# Patient Record
Sex: Female | Born: 1970 | Race: Black or African American | Hispanic: No | Marital: Single | Smoking: Former smoker
Health system: Southern US, Community
[De-identification: ages and names within clinical notes are randomized; demographics above are authoritative.]

## PROBLEM LIST (undated history)

## (undated) DIAGNOSIS — F209 Schizophrenia, unspecified: Secondary | ICD-10-CM

## (undated) DIAGNOSIS — D649 Anemia, unspecified: Secondary | ICD-10-CM

## (undated) DIAGNOSIS — I10 Essential (primary) hypertension: Secondary | ICD-10-CM

## (undated) HISTORY — DX: Schizophrenia, unspecified: F20.9

## (undated) HISTORY — PX: ABDOMINAL HYSTERECTOMY: SHX81

---

## 1998-11-25 ENCOUNTER — Observation Stay (HOSPITAL_COMMUNITY): Admission: AD | Admit: 1998-11-25 | Discharge: 1998-11-26 | Payer: Self-pay | Admitting: Obstetrics

## 1999-04-17 ENCOUNTER — Inpatient Hospital Stay (HOSPITAL_COMMUNITY): Admission: AD | Admit: 1999-04-17 | Discharge: 1999-04-17 | Payer: Self-pay | Admitting: Obstetrics & Gynecology

## 1999-04-17 ENCOUNTER — Encounter: Payer: Self-pay | Admitting: Obstetrics & Gynecology

## 1999-05-23 ENCOUNTER — Inpatient Hospital Stay (HOSPITAL_COMMUNITY): Admission: AD | Admit: 1999-05-23 | Discharge: 1999-05-27 | Payer: Self-pay | Admitting: *Deleted

## 1999-10-27 ENCOUNTER — Emergency Department (HOSPITAL_COMMUNITY): Admission: EM | Admit: 1999-10-27 | Discharge: 1999-10-27 | Payer: Self-pay | Admitting: Emergency Medicine

## 1999-10-27 ENCOUNTER — Encounter: Payer: Self-pay | Admitting: Emergency Medicine

## 2002-09-01 ENCOUNTER — Encounter: Payer: Self-pay | Admitting: Emergency Medicine

## 2002-09-01 ENCOUNTER — Emergency Department (HOSPITAL_COMMUNITY): Admission: EM | Admit: 2002-09-01 | Discharge: 2002-09-01 | Payer: Self-pay | Admitting: Emergency Medicine

## 2002-11-11 ENCOUNTER — Emergency Department (HOSPITAL_COMMUNITY): Admission: EM | Admit: 2002-11-11 | Discharge: 2002-11-11 | Payer: Self-pay | Admitting: Emergency Medicine

## 2004-04-11 ENCOUNTER — Inpatient Hospital Stay (HOSPITAL_COMMUNITY): Admission: AD | Admit: 2004-04-11 | Discharge: 2004-04-11 | Payer: Self-pay | Admitting: Gynecology

## 2005-02-06 ENCOUNTER — Emergency Department (HOSPITAL_COMMUNITY): Admission: EM | Admit: 2005-02-06 | Discharge: 2005-02-06 | Payer: Self-pay | Admitting: Emergency Medicine

## 2005-02-13 ENCOUNTER — Emergency Department (HOSPITAL_COMMUNITY): Admission: EM | Admit: 2005-02-13 | Discharge: 2005-02-13 | Payer: Self-pay | Admitting: Emergency Medicine

## 2005-03-03 ENCOUNTER — Emergency Department (HOSPITAL_COMMUNITY): Admission: EM | Admit: 2005-03-03 | Discharge: 2005-03-03 | Payer: Self-pay | Admitting: Emergency Medicine

## 2005-04-06 ENCOUNTER — Emergency Department (HOSPITAL_COMMUNITY): Admission: EM | Admit: 2005-04-06 | Discharge: 2005-04-07 | Payer: Self-pay | Admitting: *Deleted

## 2005-06-09 ENCOUNTER — Emergency Department (HOSPITAL_COMMUNITY): Admission: EM | Admit: 2005-06-09 | Discharge: 2005-06-09 | Payer: Self-pay | Admitting: Emergency Medicine

## 2005-07-14 ENCOUNTER — Emergency Department (HOSPITAL_COMMUNITY): Admission: EM | Admit: 2005-07-14 | Discharge: 2005-07-14 | Payer: Self-pay | Admitting: Emergency Medicine

## 2005-12-03 ENCOUNTER — Emergency Department (HOSPITAL_COMMUNITY): Admission: EM | Admit: 2005-12-03 | Discharge: 2005-12-03 | Payer: Self-pay | Admitting: Emergency Medicine

## 2005-12-22 ENCOUNTER — Emergency Department (HOSPITAL_COMMUNITY): Admission: EM | Admit: 2005-12-22 | Discharge: 2005-12-22 | Payer: Self-pay | Admitting: Emergency Medicine

## 2006-01-03 ENCOUNTER — Emergency Department (HOSPITAL_COMMUNITY): Admission: EM | Admit: 2006-01-03 | Discharge: 2006-01-03 | Payer: Self-pay | Admitting: Emergency Medicine

## 2006-07-23 ENCOUNTER — Emergency Department (HOSPITAL_COMMUNITY): Admission: EM | Admit: 2006-07-23 | Discharge: 2006-07-24 | Payer: Self-pay | Admitting: Emergency Medicine

## 2006-07-27 ENCOUNTER — Emergency Department (HOSPITAL_COMMUNITY): Admission: EM | Admit: 2006-07-27 | Discharge: 2006-07-27 | Payer: Self-pay | Admitting: Emergency Medicine

## 2006-09-23 ENCOUNTER — Emergency Department (HOSPITAL_COMMUNITY): Admission: EM | Admit: 2006-09-23 | Discharge: 2006-09-23 | Payer: Self-pay | Admitting: Emergency Medicine

## 2006-11-28 ENCOUNTER — Emergency Department (HOSPITAL_COMMUNITY): Admission: EM | Admit: 2006-11-28 | Discharge: 2006-11-28 | Payer: Self-pay | Admitting: Emergency Medicine

## 2006-12-04 ENCOUNTER — Emergency Department (HOSPITAL_COMMUNITY): Admission: EM | Admit: 2006-12-04 | Discharge: 2006-12-04 | Payer: Self-pay | Admitting: Emergency Medicine

## 2006-12-05 ENCOUNTER — Emergency Department (HOSPITAL_COMMUNITY): Admission: EM | Admit: 2006-12-05 | Discharge: 2006-12-05 | Payer: Self-pay | Admitting: Emergency Medicine

## 2007-02-03 ENCOUNTER — Emergency Department (HOSPITAL_COMMUNITY): Admission: EM | Admit: 2007-02-03 | Discharge: 2007-02-03 | Payer: Self-pay | Admitting: Family Medicine

## 2007-09-04 ENCOUNTER — Inpatient Hospital Stay (HOSPITAL_COMMUNITY): Admission: AD | Admit: 2007-09-04 | Discharge: 2007-09-04 | Payer: Self-pay | Admitting: Obstetrics & Gynecology

## 2007-09-10 ENCOUNTER — Emergency Department (HOSPITAL_COMMUNITY): Admission: EM | Admit: 2007-09-10 | Discharge: 2007-09-10 | Payer: Self-pay | Admitting: Emergency Medicine

## 2007-09-24 ENCOUNTER — Emergency Department (HOSPITAL_COMMUNITY): Admission: EM | Admit: 2007-09-24 | Discharge: 2007-09-24 | Payer: Self-pay | Admitting: Emergency Medicine

## 2007-10-09 ENCOUNTER — Inpatient Hospital Stay (HOSPITAL_COMMUNITY): Admission: AD | Admit: 2007-10-09 | Discharge: 2007-10-09 | Payer: Self-pay | Admitting: Obstetrics & Gynecology

## 2007-11-25 ENCOUNTER — Ambulatory Visit: Payer: Self-pay | Admitting: Obstetrics and Gynecology

## 2007-11-25 ENCOUNTER — Inpatient Hospital Stay (HOSPITAL_COMMUNITY): Admission: AD | Admit: 2007-11-25 | Discharge: 2007-11-26 | Payer: Self-pay | Admitting: Obstetrics and Gynecology

## 2007-11-27 ENCOUNTER — Ambulatory Visit: Payer: Self-pay | Admitting: Physician Assistant

## 2007-11-27 ENCOUNTER — Encounter: Payer: Self-pay | Admitting: Family Medicine

## 2007-11-27 ENCOUNTER — Observation Stay (HOSPITAL_COMMUNITY): Admission: AD | Admit: 2007-11-27 | Discharge: 2007-11-27 | Payer: Self-pay | Admitting: Family Medicine

## 2008-04-08 ENCOUNTER — Emergency Department (HOSPITAL_COMMUNITY): Admission: EM | Admit: 2008-04-08 | Discharge: 2008-04-09 | Payer: Self-pay | Admitting: Emergency Medicine

## 2008-04-10 ENCOUNTER — Emergency Department (HOSPITAL_COMMUNITY): Admission: EM | Admit: 2008-04-10 | Discharge: 2008-04-10 | Payer: Self-pay | Admitting: Emergency Medicine

## 2008-04-19 ENCOUNTER — Inpatient Hospital Stay (HOSPITAL_COMMUNITY): Admission: AD | Admit: 2008-04-19 | Discharge: 2008-04-19 | Payer: Self-pay | Admitting: Obstetrics & Gynecology

## 2008-08-20 ENCOUNTER — Emergency Department (HOSPITAL_COMMUNITY): Admission: EM | Admit: 2008-08-20 | Discharge: 2008-08-20 | Payer: Self-pay | Admitting: Emergency Medicine

## 2008-08-21 ENCOUNTER — Emergency Department (HOSPITAL_COMMUNITY): Admission: EM | Admit: 2008-08-21 | Discharge: 2008-08-21 | Payer: Self-pay | Admitting: Family Medicine

## 2008-08-22 ENCOUNTER — Encounter (HOSPITAL_BASED_OUTPATIENT_CLINIC_OR_DEPARTMENT_OTHER): Admission: RE | Admit: 2008-08-22 | Discharge: 2008-09-25 | Payer: Self-pay | Admitting: Internal Medicine

## 2008-12-07 ENCOUNTER — Emergency Department (HOSPITAL_COMMUNITY): Admission: EM | Admit: 2008-12-07 | Discharge: 2008-12-07 | Payer: Self-pay | Admitting: Emergency Medicine

## 2008-12-22 ENCOUNTER — Inpatient Hospital Stay (HOSPITAL_COMMUNITY): Admission: AD | Admit: 2008-12-22 | Discharge: 2008-12-22 | Payer: Self-pay | Admitting: Obstetrics & Gynecology

## 2009-01-07 ENCOUNTER — Inpatient Hospital Stay (HOSPITAL_COMMUNITY): Admission: AD | Admit: 2009-01-07 | Discharge: 2009-01-07 | Payer: Self-pay | Admitting: Obstetrics and Gynecology

## 2009-01-07 ENCOUNTER — Emergency Department (HOSPITAL_BASED_OUTPATIENT_CLINIC_OR_DEPARTMENT_OTHER): Admission: EM | Admit: 2009-01-07 | Discharge: 2009-01-07 | Payer: Self-pay | Admitting: Emergency Medicine

## 2009-01-22 ENCOUNTER — Emergency Department (HOSPITAL_COMMUNITY): Admission: EM | Admit: 2009-01-22 | Discharge: 2009-01-22 | Payer: Self-pay | Admitting: Emergency Medicine

## 2009-03-08 ENCOUNTER — Inpatient Hospital Stay (HOSPITAL_COMMUNITY): Admission: AD | Admit: 2009-03-08 | Discharge: 2009-03-08 | Payer: Self-pay | Admitting: Obstetrics & Gynecology

## 2009-05-01 ENCOUNTER — Inpatient Hospital Stay (HOSPITAL_COMMUNITY): Admission: AD | Admit: 2009-05-01 | Discharge: 2009-05-01 | Payer: Self-pay | Admitting: Obstetrics & Gynecology

## 2009-05-23 ENCOUNTER — Ambulatory Visit: Payer: Self-pay | Admitting: Obstetrics & Gynecology

## 2009-05-23 ENCOUNTER — Encounter: Payer: Self-pay | Admitting: Obstetrics and Gynecology

## 2009-05-23 LAB — CONVERTED CEMR LAB
ALT: 8 units/L (ref 0–35)
AST: 19 units/L (ref 0–37)
Alkaline Phosphatase: 84 units/L (ref 39–117)
BUN: 10 mg/dL (ref 6–23)
Calcium: 8.7 mg/dL (ref 8.4–10.5)
Chloride: 108 meq/L (ref 96–112)
Creatinine, Ser: 0.98 mg/dL (ref 0.40–1.20)
HCT: 27.4 % — ABNORMAL LOW (ref 36.0–46.0)
Hemoglobin: 7.9 g/dL — CL (ref 12.0–15.0)
MCHC: 28.8 g/dL — ABNORMAL LOW (ref 30.0–36.0)
Platelets: 174 10*3/uL (ref 150–400)
Potassium: 3.3 meq/L — ABNORMAL LOW (ref 3.5–5.3)
RDW: 18.9 % — ABNORMAL HIGH (ref 11.5–15.5)
Yeast Wet Prep HPF POC: NONE SEEN

## 2009-05-30 ENCOUNTER — Ambulatory Visit (HOSPITAL_COMMUNITY): Admission: RE | Admit: 2009-05-30 | Discharge: 2009-05-30 | Payer: Self-pay | Admitting: Obstetrics and Gynecology

## 2009-05-31 ENCOUNTER — Ambulatory Visit: Payer: Self-pay | Admitting: Obstetrics & Gynecology

## 2009-06-07 ENCOUNTER — Ambulatory Visit: Payer: Self-pay | Admitting: Obstetrics & Gynecology

## 2009-06-07 ENCOUNTER — Inpatient Hospital Stay (HOSPITAL_COMMUNITY): Admission: AD | Admit: 2009-06-07 | Discharge: 2009-06-08 | Payer: Self-pay | Admitting: Obstetrics & Gynecology

## 2009-06-19 ENCOUNTER — Observation Stay (HOSPITAL_COMMUNITY): Admission: AD | Admit: 2009-06-19 | Discharge: 2009-06-20 | Payer: Self-pay | Admitting: Obstetrics & Gynecology

## 2009-06-21 ENCOUNTER — Encounter: Payer: Self-pay | Admitting: Family Medicine

## 2009-06-21 ENCOUNTER — Inpatient Hospital Stay (HOSPITAL_COMMUNITY): Admission: AD | Admit: 2009-06-21 | Discharge: 2009-06-21 | Payer: Self-pay | Admitting: Obstetrics & Gynecology

## 2009-06-25 ENCOUNTER — Encounter: Payer: Self-pay | Admitting: Family Medicine

## 2009-06-25 ENCOUNTER — Ambulatory Visit: Payer: Self-pay | Admitting: Obstetrics & Gynecology

## 2009-06-25 ENCOUNTER — Observation Stay (HOSPITAL_COMMUNITY): Admission: AD | Admit: 2009-06-25 | Discharge: 2009-06-26 | Payer: Self-pay | Admitting: Obstetrics & Gynecology

## 2009-06-25 ENCOUNTER — Ambulatory Visit: Payer: Self-pay | Admitting: Obstetrics and Gynecology

## 2009-06-26 ENCOUNTER — Encounter: Payer: Self-pay | Admitting: Family Medicine

## 2009-06-26 ENCOUNTER — Ambulatory Visit: Payer: Self-pay | Admitting: Obstetrics and Gynecology

## 2009-06-26 ENCOUNTER — Inpatient Hospital Stay (HOSPITAL_COMMUNITY): Admission: AD | Admit: 2009-06-26 | Discharge: 2009-06-26 | Payer: Self-pay | Admitting: Family Medicine

## 2009-06-27 ENCOUNTER — Inpatient Hospital Stay (HOSPITAL_COMMUNITY): Admission: AD | Admit: 2009-06-27 | Discharge: 2009-06-29 | Payer: Self-pay | Admitting: Obstetrics & Gynecology

## 2009-06-27 ENCOUNTER — Ambulatory Visit: Payer: Self-pay | Admitting: Family Medicine

## 2009-06-27 ENCOUNTER — Encounter: Payer: Self-pay | Admitting: Obstetrics & Gynecology

## 2009-10-02 ENCOUNTER — Emergency Department (HOSPITAL_COMMUNITY): Admission: EM | Admit: 2009-10-02 | Discharge: 2009-10-02 | Payer: Self-pay | Admitting: Emergency Medicine

## 2010-04-06 ENCOUNTER — Emergency Department (HOSPITAL_COMMUNITY): Admission: EM | Admit: 2010-04-06 | Discharge: 2010-04-07 | Payer: Self-pay | Admitting: Emergency Medicine

## 2010-09-08 ENCOUNTER — Encounter: Payer: Self-pay | Admitting: *Deleted

## 2010-11-20 LAB — STREP B DNA PROBE
Strep Group B Ag: NEGATIVE
Strep Group B Ag: NEGATIVE

## 2010-11-20 LAB — CBC
Platelets: 195 10*3/uL (ref 150–400)
RDW: 20.3 % — ABNORMAL HIGH (ref 11.5–15.5)
WBC: 14.4 10*3/uL — ABNORMAL HIGH (ref 4.0–10.5)

## 2010-11-20 LAB — WET PREP, GENITAL
Clue Cells Wet Prep HPF POC: NONE SEEN
Trich, Wet Prep: NONE SEEN

## 2010-11-20 LAB — RAPID URINE DRUG SCREEN, HOSP PERFORMED
Barbiturates: NOT DETECTED
Benzodiazepines: NOT DETECTED

## 2010-11-20 LAB — RPR: RPR Ser Ql: REACTIVE — AB

## 2010-11-20 LAB — T.PALLIDUM AB, IGG: T pallidum Antibodies (TP-PA): 61.4 IV — ABNORMAL HIGH (ref <1.0)

## 2010-11-20 LAB — RPR TITER: RPR Titer: 1:1 {titer}

## 2010-11-21 LAB — CBC
HCT: 23.1 % — ABNORMAL LOW (ref 36.0–46.0)
HCT: 28.1 % — ABNORMAL LOW (ref 36.0–46.0)
Hemoglobin: 8.8 g/dL — ABNORMAL LOW (ref 12.0–15.0)
MCHC: 31.3 g/dL (ref 30.0–36.0)
MCHC: 31.6 g/dL (ref 30.0–36.0)
MCV: 68.4 fL — ABNORMAL LOW (ref 78.0–100.0)
Platelets: 155 10*3/uL (ref 150–400)
RBC: 3.38 MIL/uL — ABNORMAL LOW (ref 3.87–5.11)
RBC: 4.13 MIL/uL (ref 3.87–5.11)
WBC: 4 10*3/uL (ref 4.0–10.5)

## 2010-11-21 LAB — URINALYSIS, MICROSCOPIC ONLY
Ketones, ur: NEGATIVE mg/dL
Leukocytes, UA: NEGATIVE
Nitrite: NEGATIVE
pH: 7 (ref 5.0–8.0)

## 2010-11-21 LAB — POCT URINALYSIS DIP (DEVICE)
Bilirubin Urine: NEGATIVE
Bilirubin Urine: NEGATIVE
Glucose, UA: NEGATIVE mg/dL
Glucose, UA: NEGATIVE mg/dL
Hgb urine dipstick: NEGATIVE
Ketones, ur: NEGATIVE mg/dL
Ketones, ur: NEGATIVE mg/dL
Ketones, ur: NEGATIVE mg/dL
Nitrite: NEGATIVE
Specific Gravity, Urine: 1.015 (ref 1.005–1.030)

## 2010-11-21 LAB — IRON AND TIBC: Saturation Ratios: 2 % — ABNORMAL LOW (ref 20–55)

## 2010-11-21 LAB — FOLATE RBC: RBC Folate: 1100 ng/mL — ABNORMAL HIGH (ref 180–600)

## 2010-11-21 LAB — DIFFERENTIAL
Basophils Relative: 1 % (ref 0–1)
Lymphocytes Relative: 9 % — ABNORMAL LOW (ref 12–46)
Lymphs Abs: 0.5 10*3/uL — ABNORMAL LOW (ref 0.7–4.0)
Monocytes Absolute: 0.2 10*3/uL (ref 0.1–1.0)
Monocytes Relative: 4 % (ref 3–12)
Neutro Abs: 4.8 10*3/uL (ref 1.7–7.7)
Neutrophils Relative %: 86 % — ABNORMAL HIGH (ref 43–77)

## 2010-11-21 LAB — URINE CULTURE: Special Requests: NEGATIVE

## 2010-11-21 LAB — FERRITIN: Ferritin: 7 ng/mL — ABNORMAL LOW (ref 10–291)

## 2010-11-22 LAB — URINALYSIS, ROUTINE W REFLEX MICROSCOPIC
Glucose, UA: NEGATIVE mg/dL
Protein, ur: NEGATIVE mg/dL
Specific Gravity, Urine: 1.02 (ref 1.005–1.030)
pH: 7.5 (ref 5.0–8.0)

## 2010-11-22 LAB — CBC
MCHC: 31.8 g/dL (ref 30.0–36.0)
MCV: 69.8 fL — ABNORMAL LOW (ref 78.0–100.0)
RBC: 3.88 MIL/uL (ref 3.87–5.11)

## 2010-11-22 LAB — WET PREP, GENITAL
Clue Cells Wet Prep HPF POC: NONE SEEN
Yeast Wet Prep HPF POC: NONE SEEN

## 2010-11-22 LAB — T.PALLIDUM AB, IGG: T pallidum Antibodies (TP-PA): 62.8 IV — ABNORMAL HIGH (ref <1.0)

## 2010-11-22 LAB — SICKLE CELL SCREEN: Sickle Cell Screen: NEGATIVE

## 2010-11-22 LAB — DIFFERENTIAL
Basophils Relative: 0 % (ref 0–1)
Eosinophils Absolute: 0 10*3/uL (ref 0.0–0.7)
Monocytes Relative: 11 % (ref 3–12)
Neutrophils Relative %: 69 % (ref 43–77)

## 2010-11-22 LAB — HIV ANTIBODY (ROUTINE TESTING W REFLEX): HIV: NONREACTIVE

## 2010-11-22 LAB — TYPE AND SCREEN
ABO/RH(D): A POS
Antibody Screen: NEGATIVE

## 2010-11-24 LAB — URINE MICROSCOPIC-ADD ON

## 2010-11-24 LAB — URINALYSIS, ROUTINE W REFLEX MICROSCOPIC
Nitrite: NEGATIVE
Specific Gravity, Urine: 1.01 (ref 1.005–1.030)
Urobilinogen, UA: 0.2 mg/dL (ref 0.0–1.0)

## 2010-11-24 LAB — WET PREP, GENITAL
Trich, Wet Prep: NONE SEEN
Yeast Wet Prep HPF POC: NONE SEEN

## 2010-11-24 LAB — GC/CHLAMYDIA PROBE AMP, GENITAL: Chlamydia, DNA Probe: NEGATIVE

## 2010-11-24 LAB — CBC
Hemoglobin: 7.3 g/dL — CL (ref 12.0–15.0)
Platelets: 170 10*3/uL (ref 150–400)
RDW: 21 % — ABNORMAL HIGH (ref 11.5–15.5)

## 2010-11-24 LAB — RAPID URINE DRUG SCREEN, HOSP PERFORMED: Barbiturates: NOT DETECTED

## 2010-11-25 LAB — WET PREP, GENITAL: Yeast Wet Prep HPF POC: NONE SEEN

## 2010-11-25 LAB — DIFFERENTIAL
Band Neutrophils: 0 % (ref 0–10)
Basophils Absolute: 0 10*3/uL (ref 0.0–0.1)
Basophils Relative: 0 % (ref 0–1)
Eosinophils Absolute: 0 10*3/uL (ref 0.0–0.7)
Eosinophils Relative: 0 % (ref 0–5)
Lymphocytes Relative: 25 % (ref 12–46)
Lymphs Abs: 1.1 10*3/uL (ref 0.7–4.0)
Promyelocytes Absolute: 0 %

## 2010-11-25 LAB — CBC
HCT: 26.8 % — ABNORMAL LOW (ref 36.0–46.0)
Hemoglobin: 8.6 g/dL — ABNORMAL LOW (ref 12.0–15.0)
MCHC: 32.2 g/dL (ref 30.0–36.0)
RBC: 3.99 MIL/uL (ref 3.87–5.11)
RDW: 24 % — ABNORMAL HIGH (ref 11.5–15.5)

## 2010-11-25 LAB — ABO/RH: ABO/RH(D): A POS

## 2010-11-26 LAB — URINALYSIS, ROUTINE W REFLEX MICROSCOPIC
Bilirubin Urine: NEGATIVE
Glucose, UA: NEGATIVE mg/dL
Hgb urine dipstick: NEGATIVE
Specific Gravity, Urine: 1.022 (ref 1.005–1.030)
pH: 6.5 (ref 5.0–8.0)

## 2010-11-26 LAB — CBC
Hemoglobin: 7.5 g/dL — CL (ref 12.0–15.0)
MCHC: 30.4 g/dL (ref 30.0–36.0)
MCV: 66.1 fL — ABNORMAL LOW (ref 78.0–100.0)
RBC: 3.74 MIL/uL — ABNORMAL LOW (ref 3.87–5.11)
WBC: 3.1 10*3/uL — ABNORMAL LOW (ref 4.0–10.5)

## 2010-11-26 LAB — GC/CHLAMYDIA PROBE AMP, GENITAL
Chlamydia, DNA Probe: NEGATIVE
GC Probe Amp, Genital: NEGATIVE

## 2010-11-26 LAB — POCT TOXICOLOGY PANEL: Cocaine: POSITIVE

## 2010-11-26 LAB — WET PREP, GENITAL

## 2010-11-26 LAB — BASIC METABOLIC PANEL
CO2: 25 mEq/L (ref 19–32)
Chloride: 107 mEq/L (ref 96–112)
Creatinine, Ser: 1 mg/dL (ref 0.4–1.2)
GFR calc Af Amer: >60 mL/min (ref >60)
Potassium: 3.3 mEq/L — ABNORMAL LOW (ref 3.5–5.1)
Sodium: 140 mEq/L (ref 135–145)

## 2010-11-26 LAB — HCG, QUANTITATIVE, PREGNANCY: hCG, Beta Chain, Quant, S: 184155 m[IU]/mL — ABNORMAL HIGH (ref <5)

## 2010-11-26 LAB — ETHANOL: Alcohol, Ethyl (B): <5 mg/dL (ref 0–10)

## 2010-12-31 NOTE — Discharge Summary (Signed)
Jeanette Hopkins, Jeanette Hopkins               ACCOUNT NO.:  [id number]   MEDICAL RECORD NO.:  [id number]          PATIENT TYPE:  INP   LOCATION:  9151                          FACILITY:  [facility]   PHYSICIAN:  Allie Bossier, MD        DATE OF BIRTH:  Oct 19, 1970   DATE OF ADMISSION:  11/25/2007  DATE OF DISCHARGE:  11/26/2007                               DISCHARGE SUMMARY   FINAL DIAGNOSIS:  Abruptio placentae, abruption of placenta visualized  on ultrasound.   SUMMARY OF LAB DATA:  On admission, the patient had a CBC that showed a  white blood cell count of 11.9, hemoglobin of 8.9, hematocrit of 26.3,  and platelets 192.  Type and screen showed blood type A positive,  antibody screen negative.  Urine drug screen was positive for cocaine,  positive for opiates.  Amphetamines, barbiturates, and benzodiazepines  were nondetected.   BRIEF SUMMARY OF HOSPITAL STAY:  This is a 39 year old G7, P5-0-1-5 at  49 and 6 weeks of gestation that presented to the MAU with complaints of  vaginal bleeding and abdominal pain.  This patient had a history of  recurrent abruptions and subsequent preterm labor, as well as a history  of bipolar disorder, recurrent cocaine abuse, and anemia.  An ultrasound  performed on MAU revealed a posterior and a fundal placenta with a large  clot along the anterior edge of the placenta extensively noted over most  of the anterior wall.  When first seen, the patient was contracted every  5 minutes.  Previously, she stated that she had last passed 3 clots  vaginally.  The ultrasound revealed a cervical dilation of internal os  to 3.5 cm and external os of 1 cm, vertex presentation, and fetal heart  rate of 160 beats per minute.  A cervical exam was performed and  confirmed dilatation of 1 cm, effacement of 50%, and station -2 with  funneling over the lower uterine segment.  This patient had acknowledged  ongoing cocaine use in the last 3 days.  She had been informed in the  past about  the association of cocaine use, abruption, and fetal loss.  The patient was admitted to L&D and was suspected to process to delivery  of a previable infant.  CBC and blood type were ordered.   During the course of her stay, the patient's contractions became mild  and very spare.  Her bleeding stopped.  Her vital signs were checked and  stable.  Fetal heart tones were checked for vital signs, and these were  stable as well, always remaining around 160 beats per minute.  The  patient's abdominal pain also resolved during her hospitalization.  The  day after admission, the patient was stable.  Her sterile cervical exam  was rechecked and showed a cervix of 1.5 cm, 50% effacement, and -1  station, vertex, and nonbulging membranes.  This exam was unchanged from  the day before.   Right at the time of discharge, the patient was not contracting, her  abdominal pain was resolved.  It is not clear at this time  the prognosis  of this pregnancy, but the possibilities of delivery preterm are very  high, and of delivering the baby when still in a previable age, are also  high without a good chance of survival.  This was clearly discussed with  patient, who seemed to understand.  This patient is being discharged  home today in a stable condition.  She was explained that she needs to  stay to have strict pelvic rest.  She also needs to discontinue the use  of cocaine or any other illicit type of drug.  The patient will need to  [facility] within a week of  discharge.  She needs to set up prenatal care with her primary care  physician.   DIET:  The patient can continue on regular diet as tolerated.   ACTIVITY:  The patient will need to remain in pelvic rest until  delivery.   DISPOSITION:  The patient is discharged to her home with her family  member in a stable condition.   FOLLOW UP:  She will need to follow up at [facility] within a  week of discharge.   MEDICATIONS:  The patient was instructed to discontinue any type of  illicit drugs including cocaine.  She is allowed to continue Benadryl  prescribed at [facility].  She is to  discontinue her home medications; Depakote, Seroquel, and trazodone  during her pregnancy.     ______________________________  Obstetrics Resident      Allie Bossier, MD  Electronically Signed    OR/MEDQ  D:  11/26/2007  T:  11/27/2007  Job:  [id number]   cc:   Allie Bossier, MD   [facility]
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
## 2011-05-08 LAB — URINALYSIS, ROUTINE W REFLEX MICROSCOPIC
Bilirubin Urine: NEGATIVE
Glucose, UA: NEGATIVE
Hgb urine dipstick: NEGATIVE
Ketones, ur: NEGATIVE
Nitrite: NEGATIVE
Specific Gravity, Urine: 1.011
pH: 7
pH: 6.5

## 2011-05-08 LAB — CBC
HCT: 25.5 — ABNORMAL LOW
Hemoglobin: 8.4 — ABNORMAL LOW
MCHC: 32.8
MCV: 71.7 — ABNORMAL LOW
Platelets: 241
Platelets: 276
RBC: 3.56 — ABNORMAL LOW
RDW: 19.9 — ABNORMAL HIGH
RDW: 19.3 — ABNORMAL HIGH
WBC: 4.3

## 2011-05-08 LAB — DIFFERENTIAL
Basophils Absolute: 0
Eosinophils Relative: 3
Lymphocytes Relative: 33
Lymphs Abs: 1.4
Neutro Abs: 2.2
Neutrophils Relative %: 51

## 2011-05-08 LAB — RAPID URINE DRUG SCREEN, HOSP PERFORMED
Amphetamines: NOT DETECTED
Cocaine: POSITIVE — AB
Opiates: NOT DETECTED
Tetrahydrocannabinol: NOT DETECTED

## 2011-05-08 LAB — BASIC METABOLIC PANEL
BUN: 13
Calcium: 8.7
Creatinine, Ser: 0.95
GFR calc non Af Amer: >60
Glucose, Bld: 88

## 2011-05-08 LAB — HCG, QUANTITATIVE, PREGNANCY: hCG, Beta Chain, Quant, S: 160477 — ABNORMAL HIGH

## 2011-05-08 LAB — RPR: RPR Ser Ql: NONREACTIVE

## 2011-05-08 LAB — ETHANOL: Alcohol, Ethyl (B): <5

## 2011-05-08 LAB — URINE MICROSCOPIC-ADD ON

## 2011-05-08 LAB — WET PREP, GENITAL: Trich, Wet Prep: NONE SEEN

## 2011-05-08 LAB — ABO/RH: ABO/RH(D): A POS

## 2011-05-13 LAB — CBC
HCT: 26.3 — ABNORMAL LOW
HCT: 27.7 — ABNORMAL LOW
MCHC: 33.6
MCV: 80.1
MCV: 80.4
RBC: 3.28 — ABNORMAL LOW
RBC: 3.44 — ABNORMAL LOW
WBC: 11.6 — ABNORMAL HIGH

## 2011-05-13 LAB — TYPE AND SCREEN: ABO/RH(D): A POS

## 2011-05-13 LAB — RAPID URINE DRUG SCREEN, HOSP PERFORMED
Barbiturates: NOT DETECTED
Benzodiazepines: NOT DETECTED

## 2011-05-13 LAB — ABO/RH: ABO/RH(D): A POS

## 2011-05-13 LAB — RPR: RPR Ser Ql: NONREACTIVE

## 2011-05-21 LAB — URINALYSIS, ROUTINE W REFLEX MICROSCOPIC
Bilirubin Urine: NEGATIVE
Glucose, UA: NEGATIVE
Hgb urine dipstick: NEGATIVE
Ketones, ur: NEGATIVE
Nitrite: NEGATIVE
pH: 7.5

## 2011-05-21 LAB — CBC
Platelets: 229
RDW: 18.5 — ABNORMAL HIGH
WBC: 3.6 — ABNORMAL LOW

## 2011-05-21 LAB — GC/CHLAMYDIA PROBE AMP, GENITAL
Chlamydia, DNA Probe: NEGATIVE
GC Probe Amp, Genital: NEGATIVE

## 2011-05-21 LAB — WET PREP, GENITAL: Clue Cells Wet Prep HPF POC: NONE SEEN

## 2014-02-25 ENCOUNTER — Encounter (HOSPITAL_COMMUNITY): Payer: Self-pay | Admitting: Emergency Medicine

## 2014-02-25 ENCOUNTER — Emergency Department (HOSPITAL_COMMUNITY)
Admission: EM | Admit: 2014-02-25 | Discharge: 2014-02-25 | Disposition: A | Discharge status: Home / Self Care | Payer: Medicaid Other | Attending: Emergency Medicine | Admitting: Emergency Medicine

## 2014-02-25 ENCOUNTER — Emergency Department (HOSPITAL_COMMUNITY): Payer: Medicaid Other

## 2014-02-25 DIAGNOSIS — S6990XA Unspecified injury of unspecified wrist, hand and finger(s), initial encounter: Secondary | ICD-10-CM | POA: Insufficient documentation

## 2014-02-25 DIAGNOSIS — Z862 Personal history of diseases of the blood and blood-forming organs and certain disorders involving the immune mechanism: Secondary | ICD-10-CM | POA: Insufficient documentation

## 2014-02-25 DIAGNOSIS — F17200 Nicotine dependence, unspecified, uncomplicated: Secondary | ICD-10-CM | POA: Diagnosis not present

## 2014-02-25 DIAGNOSIS — S6992XA Unspecified injury of left wrist, hand and finger(s), initial encounter: Secondary | ICD-10-CM

## 2014-02-25 DIAGNOSIS — Z791 Long term (current) use of non-steroidal anti-inflammatories (NSAID): Secondary | ICD-10-CM | POA: Insufficient documentation

## 2014-02-25 HISTORY — DX: Anemia, unspecified: D64.9

## 2014-02-25 MED ORDER — OXYCODONE-ACETAMINOPHEN 5-325 MG PO TABS
1.0000 | ORAL_TABLET | Freq: Once | ORAL | Status: AC
  Administered 2014-02-25: 1 via ORAL
  Filled 2014-02-25: qty 1

## 2014-02-25 MED ORDER — OXYCODONE-ACETAMINOPHEN 5-325 MG PO TABS: 1.0000 | ORAL_TABLET | ORAL | Status: DC | PRN

## 2014-02-25 NOTE — Progress Notes (Signed)
Orthopedic Tech Progress Note Patient Details:  Jeanette Hopkins 23-Mar-1971 [id number]  Ortho Devices Type of Ortho Device: Thumb spica splint;Arm sling Splint Material: Plaster Ortho Device/Splint Interventions: Application   Cammer, Theodoro Parma 02/25/2014, 8:35 PM

## 2014-02-25 NOTE — Discharge Instructions (Signed)
Read the information below.  Use the prescribed medication as directed.  Please discuss all new medications with your pharmacist.  Do not take additional tylenol while taking the prescribed pain medication to avoid overdose.  If there is any possibility that you might be pregnant, please let your health care provider know and discuss this with the pharmacist to ensure medication safety.  You may return to the Emergency Department at any time for worsening condition or any new symptoms that concern you.  If you develop uncontrolled pain, weakness or numbness of the extremity, severe discoloration of the skin, or you are unable to move your fingers, return to the ER for a recheck.

## 2014-02-25 NOTE — ED Notes (Signed)
Ortho tech called for splint application
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
## 2014-02-26 NOTE — ED Provider Notes (Signed)
Medical screening examination/treatment/procedure(s) were performed by non-physician practitioner and as supervising physician I was immediately available for consultation/collaboration.   EKG Interpretation None        Ezequiel Essex, MD 02/26/14 0020

## 2014-03-03 ENCOUNTER — Other Ambulatory Visit: Payer: Self-pay | Admitting: Orthopedic Surgery

## 2014-03-03 DIAGNOSIS — M79645 Pain in left finger(s): Secondary | ICD-10-CM

## 2014-03-14 ENCOUNTER — Other Ambulatory Visit: Payer: Medicaid Other

## 2014-03-14 ENCOUNTER — Inpatient Hospital Stay
Admission: RE | Admit: 2014-03-14 | Discharge: 2014-03-14 | Disposition: A | Discharge status: Home / Self Care | Payer: Medicaid Other | Source: Ambulatory Visit | Attending: Orthopedic Surgery | Admitting: Orthopedic Surgery

## 2014-03-22 ENCOUNTER — Inpatient Hospital Stay: Admission: RE | Admit: 2014-03-22 | Payer: Medicaid Other | Source: Ambulatory Visit

## 2014-05-13 ENCOUNTER — Emergency Department (HOSPITAL_COMMUNITY)
Admission: EM | Admit: 2014-05-13 | Discharge: 2014-05-13 | Disposition: A | Discharge status: Home / Self Care | Payer: Medicaid Other | Attending: Emergency Medicine | Admitting: Emergency Medicine

## 2014-05-13 ENCOUNTER — Emergency Department (HOSPITAL_COMMUNITY): Payer: Medicaid Other

## 2014-05-13 ENCOUNTER — Encounter (HOSPITAL_COMMUNITY): Payer: Self-pay | Admitting: Emergency Medicine

## 2014-05-13 DIAGNOSIS — S63509A Unspecified sprain of unspecified wrist, initial encounter: Secondary | ICD-10-CM | POA: Diagnosis not present

## 2014-05-13 DIAGNOSIS — Y9389 Activity, other specified: Secondary | ICD-10-CM | POA: Diagnosis not present

## 2014-05-13 DIAGNOSIS — S6990XA Unspecified injury of unspecified wrist, hand and finger(s), initial encounter: Secondary | ICD-10-CM | POA: Diagnosis present

## 2014-05-13 DIAGNOSIS — Z862 Personal history of diseases of the blood and blood-forming organs and certain disorders involving the immune mechanism: Secondary | ICD-10-CM | POA: Insufficient documentation

## 2014-05-13 DIAGNOSIS — R296 Repeated falls: Secondary | ICD-10-CM | POA: Diagnosis not present

## 2014-05-13 DIAGNOSIS — Y9289 Other specified places as the place of occurrence of the external cause: Secondary | ICD-10-CM | POA: Diagnosis not present

## 2014-05-13 DIAGNOSIS — Z87891 Personal history of nicotine dependence: Secondary | ICD-10-CM | POA: Insufficient documentation

## 2014-05-13 DIAGNOSIS — S63502A Unspecified sprain of left wrist, initial encounter: Secondary | ICD-10-CM

## 2014-05-13 MED ORDER — OXYCODONE-ACETAMINOPHEN 5-325 MG PO TABS
2.0000 | ORAL_TABLET | Freq: Once | ORAL | Status: AC
  Administered 2014-05-13: 2 via ORAL
  Filled 2014-05-13: qty 2

## 2014-05-13 MED ORDER — OXYCODONE-ACETAMINOPHEN 5-325 MG PO TABS: 1.0000 | ORAL_TABLET | Freq: Four times a day (QID) | ORAL | Status: DC | PRN

## 2014-05-13 MED ORDER — IBUPROFEN 800 MG PO TABS: 800.0000 mg | ORAL_TABLET | Freq: Three times a day (TID) | ORAL | Status: DC

## 2014-05-13 MED ORDER — HYDROCODONE-ACETAMINOPHEN 5-325 MG PO TABS
2.0000 | ORAL_TABLET | Freq: Once | ORAL | Status: DC
  Filled 2014-05-13: qty 2

## 2014-05-13 NOTE — ED Notes (Signed)
Pt refused to have her vitals taken  

## 2014-05-13 NOTE — ED Notes (Signed)
Pt fractured hand recently and was treated for it. Pt took splint off before appropriate time and fell on wrist re-injuring it yesterday.

## 2014-05-13 NOTE — Discharge Instructions (Signed)
Wrist Sprain  with Rehab  A sprain is an injury in which a ligament that maintains the proper alignment of a joint is partially or completely torn. The ligaments of the wrist are susceptible to sprains. Sprains are classified into three categories. Grade 1 sprains cause pain, but the tendon is not lengthened. Grade 2 sprains include a lengthened ligament because the ligament is stretched or partially ruptured. With grade 2 sprains there is still function, although the function may be diminished. Grade 3 sprains are characterized by a complete tear of the tendon or muscle, and function is usually impaired.  SYMPTOMS   · Pain tenderness, inflammation, and/or bruising (contusion) of the injury.  · A "pop" or tear felt and/or heard at the time of injury.  · Decreased wrist function.  CAUSES   A wrist sprain occurs when a force is placed on one or more ligaments that is greater than it/they can withstand. Common mechanisms of injury include:  · Catching a ball with you hands.  · Repetitive and/ or strenuous extension or flexion of the wrist.  RISK INCREASES WITH:  · Previous wrist injury.  · Contact sports (boxing or wrestling).  · Activities in which falling is common.  · Poor strength and flexibility.  · Improperly fitted or padded protective equipment.  PREVENTION  · Warm up and stretch properly before activity.  · Allow for adequate recovery between workouts.  · Maintain physical fitness:  ¨ Strength, flexibility, and endurance.  ¨ Cardiovascular fitness.  · Protect the wrist joint by limiting its motion with the use of taping, braces, or splints.  · Protect the wrist after injury for 6 to 12 months.  PROGNOSIS   The prognosis for wrist sprains depends on the degree of injury. Grade 1 sprains require 2 to 6 weeks of treatment. Grade 2 sprains require 6 to 8 weeks of treatment, and grade 3 sprains require up to 12 weeks.   RELATED COMPLICATIONS   · Prolonged healing time, if improperly treated or  re-injured.  · Recurrent symptoms that result in a chronic problem.  · Injury to nearby structures (bone, cartilage, nerves, or tendons).  · Arthritis of the wrist.  · Inability to compete in athletics at a high level.  · Wrist stiffness or weakness.  · Progression to a complete rupture of the ligament.  TREATMENT   Treatment initially involves resting from any activities that aggravate the symptoms, and the use of ice and medications to help reduce pain and inflammation. Your caregiver may recommend immobilizing the wrist for a period of time in order to reduce stress on the ligament and allow for healing. After immobilization it is important to perform strengthening and stretching exercises to help regain strength and a full range of motion. These exercises may be completed at home or with a therapist. Surgery is not usually required for wrist sprains, unless the ligament has been ruptured (grade 3 sprain).  MEDICATION   · If pain medication is necessary, then nonsteroidal anti-inflammatory medications, such as aspirin and ibuprofen, or other minor pain relievers, such as acetaminophen, are often recommended.  · Do not take pain medication for 7 days before surgery.  · Prescription pain relievers may be given if deemed necessary by your caregiver. Use only as directed and only as much as you need.  HEAT AND COLD  · Cold treatment (icing) relieves pain and reduces inflammation. Cold treatment should be applied for 10 to 15 minutes every 2 to 3 hours for inflammation   caregiver, physical therapist, or athletic trainer. Use a heat pack or soak your injury in warm water. SEEK MEDICAL CARE IF:  Treatment seems to offer no benefit, or the condition worsens.  Any  medications produce adverse side effects. EXERCISES RANGE OF MOTION (ROM) AND STRETCHING EXERCISES - Wrist Sprain  These exercises may help you when beginning to rehabilitate your injury. Your symptoms may resolve with or without further involvement from your physician, physical therapist or athletic trainer. While completing these exercises, remember:   Restoring tissue flexibility helps normal motion to return to the joints. This allows healthier, less painful movement and activity.  An effective stretch should be held for at least 30 seconds.  A stretch should never be painful. You should only feel a gentle lengthening or release in the stretched tissue. RANGE OF MOTION - Wrist Flexion, Active-Assisted  Extend your right / left elbow with your fingers pointing down.*  Gently pull the back of your hand towards you until you feel a gentle stretch on the top of your forearm.  Hold this position for __________ seconds. Repeat __________ times. Complete this exercise __________ times per day.  *If directed by your physician, physical therapist or athletic trainer, complete this stretch with your elbow bent rather than extended. RANGE OF MOTION - Wrist Extension, Active-Assisted  Extend your right / left elbow and turn your palm upwards.*  Gently pull your palm/fingertips back so your wrist extends and your fingers point more toward the ground.  You should feel a gentle stretch on the inside of your forearm.  Hold this position for __________ seconds. Repeat __________ times. Complete this exercise __________ times per day. *If directed by your physician, physical therapist or athletic trainer, complete this stretch with your elbow bent, rather than extended. RANGE OF MOTION - Supination, Active  Stand or sit with your elbows at your side. Bend your right / left elbow to 90 degrees.  Turn your palm upward until you feel a gentle stretch on the inside of your forearm.  Hold this  position for __________ seconds. Slowly release and return to the starting position. Repeat __________ times. Complete this stretch __________ times per day.  RANGE OF MOTION - Pronation, Active  Stand or sit with your elbows at your side. Bend your right / left elbow to 90 degrees.  Turn your palm downward until you feel a gentle stretch on the top of your forearm.  Hold this position for __________ seconds. Slowly release and return to the starting position. Repeat __________ times. Complete this stretch __________ times per day.  STRETCH - Wrist Flexion  Place the back of your right / left hand on a tabletop leaving your elbow slightly bent. Your fingers should point away from your body.  Gently press the back of your hand down onto the table by straightening your elbow. You should feel a stretch on the top of your forearm.  Hold this position for __________ seconds. Repeat __________ times. Complete this stretch __________ times per day.  STRETCH - Wrist Extension  Place your right / left fingertips on a tabletop leaving your elbow slightly bent. Your fingers should point backwards.  Gently press your fingers and palm down onto the table by straightening your elbow. You should feel a stretch on the inside of your forearm.  Hold this position for __________ seconds. Repeat __________ times. Complete this stretch __________ times per day.  STRENGTHENING EXERCISES - Wrist Sprain These exercises may help you when beginning to rehabilitate your injury.  They may resolve your symptoms with or without further involvement from your physician, physical therapist or athletic trainer. While completing these exercises, remember:   Muscles can gain both the endurance and the strength needed for everyday activities through controlled exercises.  Complete these exercises as instructed by your physician, physical therapist or athletic trainer. Progress with the resistance and repetition exercises  only as your caregiver advises. STRENGTH - Wrist Flexors  Sit with your right / left forearm palm-up and fully supported. Your elbow should be resting below the height of your shoulder. Allow your wrist to extend over the edge of the surface.  Loosely holding a __________ weight or a piece of rubber exercise band/tubing, slowly curl your hand up toward your forearm.  Hold this position for __________ seconds. Slowly lower the wrist back to the starting position in a controlled manner. Repeat __________ times. Complete this exercise __________ times per day.  STRENGTH - Wrist Extensors  Sit with your right / left forearm palm-down and fully supported. Your elbow should be resting below the height of your shoulder. Allow your wrist to extend over the edge of the surface.  Loosely holding a __________ weight or a piece of rubber exercise band/tubing, slowly curl your hand up toward your forearm.  Hold this position for __________ seconds. Slowly lower the wrist back to the starting position in a controlled manner. Repeat __________ times. Complete this exercise __________ times per day.  STRENGTH - Ulnar Deviators  Stand with a ____________________ weight in your right / left hand, or sit holding on to the rubber exercise band/tubing with your opposite arm supported.  Move your wrist so that your pinkie travels toward your forearm and your thumb moves away from your forearm.  Hold this position for __________ seconds and then slowly lower the wrist back to the starting position. Repeat __________ times. Complete this exercise __________ times per day STRENGTH - Radial Deviators  Stand with a ____________________ weight in your  right / left hand, or sit holding on to the rubber exercise band/tubing with your arm supported.  Raise your hand upward in front of you or pull up on the rubber tubing.  Hold this position for __________ seconds and then slowly lower the wrist back to the  starting position. Repeat __________ times. Complete this exercise __________ times per day. STRENGTH - Forearm Supinators  Sit with your right / left forearm supported on a table, keeping your elbow below shoulder height. Rest your hand over the edge, palm down.  Gently grip a hammer or a soup ladle.  Without moving your elbow, slowly turn your palm and hand upward to a "thumbs-up" position.  Hold this position for __________ seconds. Slowly return to the starting position. Repeat __________ times. Complete this exercise __________ times per day.  STRENGTH - Forearm Pronators  Sit with your right / left forearm supported on a table, keeping your elbow below shoulder height. Rest your hand over the edge, palm up.  Gently grip a hammer or a soup ladle.  Without moving your elbow, slowly turn your palm and hand upward to a "thumbs-up" position.  Hold this position for __________ seconds. Slowly return to the starting position. Repeat __________ times. Complete this exercise __________ times per day.  STRENGTH - Grip  Grasp a tennis ball, a dense sponge, or a large, rolled sock in your hand.  Squeeze as hard as you can without increasing any pain.  Hold this position for __________ seconds. Release your grip slowly.  Pronators  · Sit with your right / left forearm supported on a table, keeping your elbow below shoulder height. Rest your hand over the edge, palm up.  · Gently grip a hammer or a soup ladle.  · Without moving your elbow, slowly turn your palm and hand upward to a "thumbs-up" position.  · Hold this position for __________ seconds. Slowly return to the starting position.  Repeat __________ times. Complete this exercise __________ times per day.   STRENGTH - Grip  · Grasp a tennis ball, a dense sponge, or a large, rolled sock in your hand.  · Squeeze as hard as you can without increasing any pain.  · Hold this position for __________ seconds. Release your grip slowly.  Repeat __________ times. Complete this exercise __________ times per day.   Document Released: 08/04/2005 Document Revised: 10/27/2011 Document Reviewed: 11/16/2008  [employer]® Patient Information ©2015 [employer]. This information is not intended to replace advice given to you by your health care provider. Make sure you discuss any questions you have with your health care provider.
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
## 2014-05-14 NOTE — ED Provider Notes (Signed)
Medical screening examination/treatment/procedure(s) were performed by non-physician practitioner and as supervising physician I was immediately available for consultation/collaboration.   EKG Interpretation None       Richarda Blade, MD 05/14/14 1140

## 2015-02-09 ENCOUNTER — Emergency Department (HOSPITAL_COMMUNITY)
Admission: EM | Admit: 2015-02-09 | Discharge: 2015-02-10 | Disposition: A | Discharge status: Home / Self Care | Payer: Medicaid Other | Attending: Emergency Medicine | Admitting: Emergency Medicine

## 2015-02-09 ENCOUNTER — Emergency Department (HOSPITAL_COMMUNITY): Payer: Medicaid Other

## 2015-02-09 ENCOUNTER — Encounter (HOSPITAL_COMMUNITY): Payer: Self-pay | Admitting: *Deleted

## 2015-02-09 DIAGNOSIS — L02411 Cutaneous abscess of right axilla: Secondary | ICD-10-CM | POA: Insufficient documentation

## 2015-02-09 DIAGNOSIS — G8929 Other chronic pain: Secondary | ICD-10-CM | POA: Insufficient documentation

## 2015-02-09 DIAGNOSIS — Z862 Personal history of diseases of the blood and blood-forming organs and certain disorders involving the immune mechanism: Secondary | ICD-10-CM | POA: Diagnosis not present

## 2015-02-09 DIAGNOSIS — M79645 Pain in left finger(s): Secondary | ICD-10-CM | POA: Diagnosis not present

## 2015-02-09 DIAGNOSIS — L0390 Cellulitis, unspecified: Secondary | ICD-10-CM

## 2015-02-09 DIAGNOSIS — L02214 Cutaneous abscess of groin: Secondary | ICD-10-CM | POA: Insufficient documentation

## 2015-02-09 DIAGNOSIS — L03314 Cellulitis of groin: Secondary | ICD-10-CM | POA: Diagnosis not present

## 2015-02-09 DIAGNOSIS — R6883 Chills (without fever): Secondary | ICD-10-CM | POA: Insufficient documentation

## 2015-02-09 DIAGNOSIS — Z87828 Personal history of other (healed) physical injury and trauma: Secondary | ICD-10-CM | POA: Diagnosis not present

## 2015-02-09 DIAGNOSIS — Z87891 Personal history of nicotine dependence: Secondary | ICD-10-CM | POA: Diagnosis not present

## 2015-02-09 DIAGNOSIS — L0291 Cutaneous abscess, unspecified: Secondary | ICD-10-CM
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
## 2015-02-09 NOTE — ED Notes (Signed)
Ortho paged for split to be applied
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
## 2015-02-09 NOTE — ED Notes (Signed)
PA at bedside.

## 2015-02-10 MED ORDER — OXYCODONE-ACETAMINOPHEN 5-325 MG PO TABS: 1.0000 | ORAL_TABLET | ORAL | Status: DC | PRN

## 2015-02-10 MED ORDER — HYDROCODONE-ACETAMINOPHEN 5-325 MG PO TABS: 1.0000 | ORAL_TABLET | ORAL | Status: DC | PRN

## 2015-02-10 MED ORDER — OXYCODONE-ACETAMINOPHEN 5-325 MG PO TABS
1.0000 | ORAL_TABLET | Freq: Once | ORAL | Status: AC
  Administered 2015-02-10: 1 via ORAL
  Filled 2015-02-10: qty 1

## 2015-02-10 MED ORDER — SULFAMETHOXAZOLE-TRIMETHOPRIM 800-160 MG PO TABS: 1.0000 | ORAL_TABLET | Freq: Two times a day (BID) | ORAL | Status: DC

## 2015-02-10 MED ORDER — SULFAMETHOXAZOLE-TRIMETHOPRIM 800-160 MG PO TABS: 1.0000 | ORAL_TABLET | Freq: Two times a day (BID) | ORAL | Status: AC

## 2015-02-10 NOTE — Discharge Instructions (Signed)
Read the information below.  Use the prescribed medication as directed.  Please discuss all new medications with your pharmacist.  Do not take additional tylenol while taking the prescribed pain medication to avoid overdose.  You may return to the Emergency Department at any time for worsening condition or any new symptoms that concern you.    If there is any possibility that you might be pregnant, please let your health care provider know and discuss this with the pharmacist to ensure medication safety.  If you develop increased redness, swelling, uncontrolled pain, or fevers greater than 100.4, return to the ER immediately for a recheck.     Please use warm moist compresses on the abscesses several times a day and do warm tub soaks to encourage drainage.  If they do not drain on their own or improve significantly with antibiotics, you may need to return to the Emergency Department to have them cut open and drained.    Abscess An abscess (boil or furuncle) is an infected area on or under the skin. This area is filled with yellowish-white fluid (pus) and other material (debris). HOME CARE   Only take medicines as told by your doctor.  If you were given antibiotic medicine, take it as directed. Finish the medicine even if you start to feel better.  If gauze is used, follow your doctor's directions for changing the gauze.  To avoid spreading the infection:  Keep your abscess covered with a bandage.  Wash your hands well.  Do not share personal care items, towels, or whirlpools with others.  Avoid skin contact with others.  Keep your skin and clothes clean around the abscess.  Keep all doctor visits as told. GET HELP RIGHT AWAY IF:   You have more pain, puffiness (swelling), or redness in the wound site.  You have more fluid or blood coming from the wound site.  You have muscle aches, chills, or you feel sick.  You have a fever. MAKE SURE YOU:   Understand these  instructions.  Will watch your condition.  Will get help right away if you are not doing well or get worse. Document Released: 01/21/2008 Document Revised: 02/03/2012 Document Reviewed: 10/17/2011 [facility] Patient Information 2015 [place], [place]. This information is not intended to replace advice given to you by your health care provider. Make sure you discuss any questions you have with your health care provider.  Cellulitis Cellulitis is an infection of the skin and the tissue under the skin. The infected area is usually red and tender. This happens most often in the arms and lower legs. HOME CARE   Take your antibiotic medicine as told. Finish the medicine even if you start to feel better.  Keep the infected arm or leg raised (elevated).  Put a warm cloth on the area up to 4 times per day.  Only take medicines as told by your doctor.  Keep all doctor visits as told. GET HELP IF:  You see red streaks on the skin coming from the infected area.  Your red area gets bigger or turns a dark color.  Your bone or joint under the infected area is painful after the skin heals.  Your infection comes back in the same area or different area.  You have a puffy (swollen) bump in the infected area.  You have new symptoms.  You have a fever. GET HELP RIGHT AWAY IF:   You feel very sleepy.  You throw up (vomit) or have watery poop (diarrhea).  You feel sick and have muscle aches and pains. MAKE SURE YOU:   Understand these instructions.  Will watch your condition.  Will get help right away if you are not doing well or get worse. Document Released: 01/21/2008 Document Revised: 12/19/2013 Document Reviewed: 10/20/2011 [facility] Patient Information 2015 [place], [place]. This information is not intended to replace advice given to you by your health care provider. Make sure you discuss any questions you have with your health care provider.

## 2015-02-10 NOTE — ED Notes (Signed)
Ortho at bedside.

## 2015-02-10 NOTE — Progress Notes (Signed)
Orthopedic Tech Progress Note Patient Details:  Jeanette Hopkins Mar 02, 1971 [id number]  Ortho Devices Type of Ortho Device: Ace wrap, Thumb spica splint Splint Material: Plaster Ortho Device/Splint Interventions: Application   Cammer, Theodoro Parma 02/10/2015, 6:41 AM

## 2021-03-25 ENCOUNTER — Other Ambulatory Visit: Payer: Self-pay

## 2021-03-25 ENCOUNTER — Ambulatory Visit (HOSPITAL_COMMUNITY): Admission: EM | Admit: 2021-03-25 | Discharge: 2021-03-25 | Discharge status: Against Medical Advice | Payer: Self-pay

## 2021-03-29 ENCOUNTER — Encounter: Payer: Self-pay | Admitting: *Deleted

## 2021-03-29 NOTE — Congregational Nurse Program (Signed)
  Dept: [place] Nurse Program Note  Date of Encounter: 03/29/2021  Past Medical History: Past Medical History:  Diagnosis Date   Anemia     Encounter Details:  CNP Questionnaire - 03/29/21 1014       Questionnaire   Do you give verbal consent to treat you today? Yes    Visit Setting Church or Organization    Location Patient Served At [facility]    Patient Status Not Applicable    Medical Provider No    Insurance Unknown    Intervention Advocate;Refer    Referrals Behavioral Health Urgent Care             Client came in [facility] requesting help with mental health services. Client has been incarcerated 5 years and recently released. She wants help with medications. Client says she had medicaid 5 yrs ago. Referred to [facility]. Client went before and left because armband was placed on her and she was afraid she would be held in hospital. [facility] process and need for an armband. Client acknowledged understanding and plans to go back and be assessed. Naaman Curro W RN CN [phone]

## 2021-04-01 ENCOUNTER — Ambulatory Visit (INDEPENDENT_AMBULATORY_CARE_PROVIDER_SITE_OTHER): Payer: No Payment, Other | Admitting: Licensed Clinical Social Worker

## 2021-04-01 ENCOUNTER — Ambulatory Visit (HOSPITAL_COMMUNITY)
Admission: EM | Admit: 2021-04-01 | Discharge: 2021-04-01 | Disposition: A | Discharge status: Home / Self Care | Payer: No Payment, Other

## 2021-04-01 ENCOUNTER — Other Ambulatory Visit: Payer: Self-pay

## 2021-04-01 ENCOUNTER — Encounter (HOSPITAL_COMMUNITY): Payer: Self-pay | Admitting: Licensed Clinical Social Worker

## 2021-04-01 DIAGNOSIS — F3162 Bipolar disorder, current episode mixed, moderate: Secondary | ICD-10-CM

## 2021-04-01 DIAGNOSIS — Z79899 Other long term (current) drug therapy: Secondary | ICD-10-CM

## 2021-04-01 NOTE — ED Notes (Signed)
Discharge instructions provided and Pt stated understanding. Pt alert, orient and ambulatory prior to d/c. Pt instructed to go upstairs for medicine management. Safety maintained.

## 2021-04-01 NOTE — BH Assessment (Signed)
Pt reports she was released from jail and as part of her post release task she needed to get started back on her psychotropic medications. Pt reports she went to [facility] but was referred here. Pt denies SI, HI, AVH. Pt is not in crisis. Pt was seen by the provider and sent to open access to establish care for medication management. Pt in agreement with plan.

## 2021-04-01 NOTE — ED Provider Notes (Signed)
Patient was referred to [facility] OP treatment (via [facility]) for medication management after being released from prison. Patient accidentally checked in at the urgent care rather than the OP clinic on the 2nd floor. This provider went to meet patient and was able to assess that patient was psychiatrically stable at this time denying SI, HI, and AVH and only wanted to establish care. Patient was cleared to go to the second floor, as the appts are walk-in to establish care and provider wanted patient to have the best chance of being seen today.   PGY-2 Damita Dunnings, MD

## 2021-04-01 NOTE — Progress Notes (Signed)
Comprehensive Clinical Assessment (CCA) Note  04/01/2021 KAIMANA LAYMANCE [id number]   Visit Diagnosis: bipolar 1 mixed   Virtual Visit via Telephone Note  I connected with Jeanette Hopkins on 04/01/21 at  1:00 PM EDT by telephone and verified that I am speaking with the correct person using two identifiers.  Location: Patient: Jeanette Hopkins  Provider: [facility]    I discussed the limitations, risks, security and privacy concerns of performing an evaluation and management service by telephone and the availability of in person appointments. I also discussed with the patient that there may be a patient responsible charge related to this service. The patient expressed understanding and agreed to proceed.   Client is a 50 year old female. Client is referred by self for a bipolar disorder.   Client states mental health symptoms as evidenced by     Client denies suicidal and homicidal ideations currently. [place] suicide scale competed.  Client denies hallucinations and delusions currently.    Client was screened for the following SDOH: smoking, depression, financials, social interaction and stress/tension   Assessment Information that integrates subjective and objective details with a therapist's professional interpretation:     Pt was alert and oriented x 5. She was not observed at pt did phone assessment. She presented with anxious and depressed mood. Pt was pleasant and cooperative.   Pt comes in today as a walk in. she reports that she just served 5 years and 10 months in prison. She was release 10 days ago. Pt is currently Remeron for medication management. Jeanette Hopkins states she has Hx with abilify injections and Zoloft. Jeanette Hopkins states that Seroquel has work best with trazadone in the past. LCSW advised pt to come in for walk in assessment to discuss options further with provider.   Pt does have a Hx of cocaine dependence. She has been clean/sober for 5+ year not intervention  needed at this time. Jeanette Hopkins reports that She has good support through her mother and her church. She has 6 total children 3 she does not talk too and 3 she does. The ones she does talk to pt describes her relationship as average. Pt has 4 brothers/sister who she describes their relationship as average. Jeanette Hopkins is reapplying for Medicaid and SSDI but has just started the process in the last 7 days. Goal for pt is to establish with [facility] medication provider    Client meets criteria for: Bipolar 1    Client states use of the following substances: Hx of cocaine     Treatment recommendations are included plan: F/u with medication provider in next 7 days via walk in appointments    Client agreed with treatment recommendations.      I discussed the assessment and treatment plan with the patient. The patient was provided an opportunity to ask questions and all were answered. The patient agreed with the plan and demonstrated an understanding of the instructions.   The patient was advised to call back or seek an in-person evaluation if the symptoms worsen or if the condition fails to improve as anticipated.  I provided 45 minutes of non-face-to-face time during this encounter.   Dory Horn, LCSW    CCA Screening, Triage and Referral (STR)  Patient Reported Information How did you hear about [place]? Self      What Is the Reason for Your Visit/Call Today? medications  How Long Has This Been Causing You Problems? <Week  What Do You Feel Would Help You the Most Today?  Medication(s)   Have You Recently Been in Any Inpatient Treatment ([facility]/Detox/Crisis Center/28-Day Program)? No   Have You Ever Received Services From [employer] Before? Yes  Who Do You See at [facility]? [place]   Have You Recently Had Any Thoughts About Hurting Yourself? No  Are You Planning to Commit Suicide/Harm Yourself At This time? No   Have you Recently Had Thoughts About [place]? No  Explanation: No data recorded  Have You Used Any Alcohol or Drugs in the Past 24 Hours? No   Do You Currently Have a Therapist/Psychiatrist? No   Have You Been Recently Discharged From Any Office Practice or Programs? No     CCA Screening Triage Referral Assessment Type of Contact: Face-to-Face  Is CPS involved or ever been involved? Never  Is APS involved or ever been involved? Never   Patient Determined To Be At Risk for Harm To Self or Others Based on Review of Patient Reported Information or Presenting Complaint? No   Location of Assessment: [facility] [place] of Residence: [place]   Determination of Need: Routine (7 days)   Options For Referral: Outpatient Therapy; Medication Management     CCA Biopsychosocial Intake/Chief Complaint:  Medication mangment from prison released 1 week ago  Current Symptoms/Problems: irritable, insmonia, anxiety, tension, worry, parnoid: "thinking people are talking about me and stuff"   Patient Reported Schizophrenia/Schizoaffective Diagnosis in Past: No   Strengths: none reported  Preferences: No data recorded Abilities: TV   Type of Services Patient Feels are Needed: therapy and medication mgmt.   Initial Clinical Notes/Concerns: insomnia   Mental Health Symptoms Depression:   Difficulty Concentrating; Fatigue; Weight gain/loss; Sleep (too much or little); Irritability   Duration of Depressive symptoms:  Greater than two weeks   Mania:   Irritability; Racing thoughts; Recklessness   Anxiety:    Worrying; Tension   Psychosis:   None   Duration of Psychotic symptoms: No data recorded  Trauma:   Avoids reminders of event; Re-experience of traumatic event   Obsessions:   N/A   Compulsions:   N/A   Inattention:  No data recorded  Hyperactivity/Impulsivity:   N/A   Oppositional/Defiant Behaviors:   N/A   Emotional Irregularity:   N/A   Other  Mood/Personality Symptoms:  No data recorded   Mental Status Exam Appearance and self-care  Stature:   Average   Weight:   Obese   Clothing:  No data recorded  Grooming:  No data recorded  Cosmetic use:  No data recorded  Posture/gait:  No data recorded  Motor activity:  No data recorded  Sensorium  Attention:   Normal   Concentration:   Normal   Orientation:   X5   Recall/memory:   Normal   Affect and Mood  Affect:   Anxious; Depressed   Mood:   Anxious; Depressed   Relating  Eye contact:   Normal   Facial expression:  No data recorded  Attitude toward examiner:  No data recorded  Thought and Language  Speech flow:  Clear and Coherent   Thought content:   Appropriate to Mood and Circumstances   Preoccupation:   None   Hallucinations:   None   Organization:  No data recorded  [employer] of Knowledge:   Average   Intelligence:   Average   Abstraction:   Functional   Judgement:   Fair   Reality Testing:   Unaware   Insight:  Fair   Decision Making:   Normal   Social Functioning  Social Maturity:   Isolates   Social Judgement:   Normal   Stress  Stressors:   Museum/gallery curator; Scientist, research (physical sciences); Family conflict; Housing; Transitions   Coping Ability:   Overwhelmed   Skill Deficits:   Interpersonal   Supports:   Church; Family; Friends/Service system     Religion: Religion/Spirituality Are You A Religious Person?: Yes What is Your Religious Affiliation?: International aid/development worker: Leisure / Recreation Do You Have Hobbies?: No  Exercise/Diet: Exercise/Diet Do You Exercise?: No Do You Follow a Special Diet?: No Do You Have Any Trouble Sleeping?: No   CCA Employment/Education Employment/Work Situation: Employment / Work Situation Employment Situation: Unemployed Patient's Job has Been Impacted by Current Illness: No Has Patient ever Been in Passenger transport manager?: No  Education: Education Is Patient Currently  Attending School?: No Last Grade Completed: 8 Did Teacher, adult education From [employer]?: No Did You Nutritional therapist?: No Did Heritage manager?: No Did You Have An Individualized Education Program (IIEP): No Did You Have Any Difficulty At [employer]?: No Patient's Education Has Been Impacted by Current Illness: No   CCA Family/Childhood History Family and Relationship History: Family history Marital status: Single Are you sexually active?: No What is your sexual orientation?: hetrosexual Has your sexual activity been affected by drugs, alcohol, medication, or emotional stress?: none reported Does patient have children?: Yes How many children?: 6 How is patient's relationship with their children?: 3 chiildren she talks to the other three she does not. The 3 children she talks to suffer from bipolar disorder  Childhood History:  Childhood History By whom was/is the patient raised?: Mother Description of patient's relationship with caregiver when they were a child: good Patient's description of current relationship with people who raised him/her: good How were you disciplined when you got in trouble as a child/adolescent?: "whoopings" Does patient have siblings?: Yes Number of Siblings: 4 Description of patient's current relationship with siblings: average Did patient suffer any verbal/emotional/physical/sexual abuse as a child?: No Did patient suffer from severe childhood neglect?: No Has patient ever been sexually abused/assaulted/raped as an adolescent or adult?: No Was the patient ever a victim of a crime or a disaster?: No Witnessed domestic violence?: No Has patient been affected by domestic violence as an adult?: No  Child/Adolescent Assessment:     CCA Substance Use Alcohol/Drug Use: Alcohol / Drug Use History of alcohol / drug use?: Yes Negative Consequences of Use: Financial, Legal, Personal relationships, Work / School Substance #1 Name of Substance 1: cocaine 1 -  Age of First Use: 20s 1 - Amount (size/oz): 2 to 3 grams 1 - Frequency: daily 1 - Duration: 20 years 1 - Last Use / Amount: 5 years ago 1 - Method of Aquiring: dealer 1- Route of Use: inhale     DSM5 Diagnoses: There are no problems to display for this patient.     Dory Horn, LCSW

## 2021-04-02 ENCOUNTER — Encounter (HOSPITAL_COMMUNITY): Payer: Self-pay | Admitting: Psychiatry

## 2021-04-02 ENCOUNTER — Ambulatory Visit (INDEPENDENT_AMBULATORY_CARE_PROVIDER_SITE_OTHER): Payer: No Payment, Other | Admitting: Psychiatry

## 2021-04-02 ENCOUNTER — Other Ambulatory Visit: Payer: Self-pay

## 2021-04-02 VITALS — BP 130/86 | HR 73 | Ht 68.0 in | Wt 285.0 lb

## 2021-04-02 DIAGNOSIS — F3162 Bipolar disorder, current episode mixed, moderate: Secondary | ICD-10-CM | POA: Diagnosis not present

## 2021-04-02 MED ORDER — TRAZODONE HCL 50 MG PO TABS
50.0000 mg | ORAL_TABLET | Freq: Every day | ORAL | 2 refills | Status: DC
  Filled 2021-04-02: qty 60, 30d supply, fill #0
  Filled 2021-05-20: qty 60, 30d supply, fill #1
  Filled 2021-06-19: qty 60, 30d supply, fill #2

## 2021-04-02 MED ORDER — QUETIAPINE FUMARATE 100 MG PO TABS
100.0000 mg | ORAL_TABLET | Freq: Every day | ORAL | 2 refills | Status: DC
  Filled 2021-04-02: qty 30, 30d supply, fill #0
  Filled 2021-05-20: qty 30, 30d supply, fill #1
  Filled 2021-06-19: qty 30, 30d supply, fill #2
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
## 2021-05-20 ENCOUNTER — Other Ambulatory Visit: Payer: Self-pay

## 2021-05-28 ENCOUNTER — Ambulatory Visit (INDEPENDENT_AMBULATORY_CARE_PROVIDER_SITE_OTHER): Payer: Medicaid Other

## 2021-05-28 ENCOUNTER — Other Ambulatory Visit: Payer: Self-pay

## 2021-05-28 ENCOUNTER — Other Ambulatory Visit (HOSPITAL_COMMUNITY)
Admission: RE | Admit: 2021-05-28 | Discharge: 2021-05-28 | Disposition: A | Discharge status: Home / Self Care | Payer: Medicaid Other | Source: Ambulatory Visit

## 2021-05-28 VITALS — BP 118/77 | HR 85 | Ht 68.0 in | Wt 289.0 lb

## 2021-05-28 DIAGNOSIS — Z90710 Acquired absence of both cervix and uterus: Secondary | ICD-10-CM

## 2021-05-28 DIAGNOSIS — F319 Bipolar disorder, unspecified: Secondary | ICD-10-CM

## 2021-05-28 DIAGNOSIS — Z01419 Encounter for gynecological examination (general) (routine) without abnormal findings: Secondary | ICD-10-CM

## 2021-05-28 DIAGNOSIS — L304 Erythema intertrigo: Secondary | ICD-10-CM | POA: Diagnosis not present

## 2021-05-28 DIAGNOSIS — Z113 Encounter for screening for infections with a predominantly sexual mode of transmission: Secondary | ICD-10-CM

## 2021-05-28 MED ORDER — MICONAZOLE NITRATE 2 % EX CREA: 1.0000 "application " | TOPICAL_CREAM | Freq: Two times a day (BID) | CUTANEOUS | 0 refills | Status: DC

## 2021-05-28 NOTE — Progress Notes (Signed)
   Subjective:     Jeanette Hopkins is a 50 y.o. female here at [facility] for a routine exam.  Current complaints: itching in groin for 3 months. Personal health questionnaire reviewed: yes.  Do you have a primary care provider? yes Do you feel safe at home? yes  [name] Office Visit from 05/28/2021 in [place]  PHQ-2 Total Score 4       Health Maintenance Due  Topic Date Due   COVID-19 Vaccine (1) Never done   MAMMOGRAM  Never done   Hepatitis C Screening  Never done   TETANUS/TDAP  Never done   Zoster Vaccines- Shingrix (1 of 2) Never done   PAP SMEAR-Modifier  Never done   COLONOSCOPY (Pts 45-75yrs Insurance coverage will need to be confirmed)  Never done   INFLUENZA VACCINE  Never done     Risk factors for chronic health problems: Smoking: denies Alchohol/how much: denies Illicit drug use: denies Pt BMI: Body mass index is 43.94 kg/m.   Gynecologic History No LMP recorded. Patient has had a hysterectomy. Contraception: none Sexual health: no issues Last Pap: unknown. Results were: normal per patient Last mammogram: never had  Obstetric History OB History  No obstetric history on file.   The following portions of the patient's history were reviewed and updated as appropriate: allergies, current medications, past family history, past medical history, past social history, past surgical history, and problem list.  Review of Systems Pertinent items are noted in HPI.    Objective:   BP 118/77 (BP Location: Left Arm, Cuff Size: Large)   Pulse 85   Ht 5\' 8"  (1.727 m)   Wt 289 lb (131.1 kg)   BMI 43.94 kg/m  VS reviewed, nursing note reviewed,  Constitutional: well developed, well nourished, no distress HEENT: normocephalic CV: normal rate Pulm/chest wall: normal effort Breast Exam: performed: right breast normal without mass, skin or nipple changes or axillary nodes, left breast normal without mass, skin or nipple changes  or axillary nodes Abdomen: soft Neuro: alert and oriented x 3 Skin: warm, dry; excoriation present along groin, skin is cracked and erythematous Psych: affect normal Pelvic exam: Performed: cervix not visualized; scant white creamy discharge, vaginal walls and external genitalia normal      Assessment/Plan:   1. Well woman exam with routine gynecological exam - Normal well woman exam - Patient had total hysterectomy for AUB in 2013. Denies history of abnormal pap smears. Pap not indicated today  - MM Digital Screening; Future  2. Screen for STD (sexually transmitted disease)  - Cervicovaginal ancillary only( [place]) - Ambulatory referral to [place] - HepB+HepC+HIV Panel - [facility]  3. Intertrigo  - miconazole (MICATIN) 2 % cream; Apply 1 application topically 2 (two) times daily.  Dispense: 28.35 g; Refill: 0     Follow up in: 1  year  or as needed.    [name], CNM 05/28/21 4:13 PM

## 2021-05-28 NOTE — Progress Notes (Signed)
NEW GYN presents for AEX/PAP/STD screening. Referral from [place].  C/o severe itching in groin.

## 2021-05-29 ENCOUNTER — Ambulatory Visit (HOSPITAL_BASED_OUTPATIENT_CLINIC_OR_DEPARTMENT_OTHER)
Admission: RE | Admit: 2021-05-29 | Discharge: 2021-05-29 | Disposition: A | Discharge status: Home / Self Care | Payer: Medicaid Other | Source: Ambulatory Visit

## 2021-05-29 DIAGNOSIS — Z1231 Encounter for screening mammogram for malignant neoplasm of breast: Secondary | ICD-10-CM | POA: Insufficient documentation

## 2021-05-29 DIAGNOSIS — Z01419 Encounter for gynecological examination (general) (routine) without abnormal findings: Secondary | ICD-10-CM | POA: Insufficient documentation

## 2021-05-30 LAB — CERVICOVAGINAL ANCILLARY ONLY
Bacterial Vaginitis (gardnerella): POSITIVE — AB
Candida Glabrata: NEGATIVE
Candida Vaginitis: NEGATIVE
Chlamydia: NEGATIVE
Comment: NEGATIVE
Comment: NEGATIVE
Comment: NEGATIVE
Comment: NEGATIVE
Comment: NEGATIVE
Comment: NORMAL
Neisseria Gonorrhea: NEGATIVE
Trichomonas: NEGATIVE

## 2021-05-31 LAB — HEPB+HEPC+HIV PANEL
HIV Screen 4th Generation wRfx: NONREACTIVE
Hep B C IgM: NEGATIVE
Hep B Core Total Ab: POSITIVE — AB
Hep B E Ab: POSITIVE — AB
Hep B E Ag: NEGATIVE
Hep B Surface Ab, Qual: REACTIVE
Hep C Virus Ab: 0.1 s/co ratio (ref 0.0–0.9)
Hepatitis B Surface Ag: NEGATIVE

## 2021-05-31 LAB — RPR, QUANT+TP ABS (REFLEX)
Rapid Plasma Reagin, Quant: 1:1 {titer} — ABNORMAL HIGH
T Pallidum Abs: REACTIVE — AB

## 2021-05-31 LAB — RPR: RPR Ser Ql: REACTIVE — AB

## 2021-06-02 ENCOUNTER — Emergency Department (HOSPITAL_BASED_OUTPATIENT_CLINIC_OR_DEPARTMENT_OTHER)
Admission: EM | Admit: 2021-06-02 | Discharge: 2021-06-03 | Disposition: A | Discharge status: Home / Self Care | Payer: Medicaid Other | Attending: Emergency Medicine | Admitting: Emergency Medicine

## 2021-06-02 ENCOUNTER — Other Ambulatory Visit: Payer: Self-pay

## 2021-06-02 ENCOUNTER — Encounter (HOSPITAL_BASED_OUTPATIENT_CLINIC_OR_DEPARTMENT_OTHER): Payer: Self-pay | Admitting: Obstetrics and Gynecology

## 2021-06-02 ENCOUNTER — Emergency Department (HOSPITAL_BASED_OUTPATIENT_CLINIC_OR_DEPARTMENT_OTHER): Payer: Medicaid Other

## 2021-06-02 DIAGNOSIS — S8992XA Unspecified injury of left lower leg, initial encounter: Secondary | ICD-10-CM | POA: Insufficient documentation

## 2021-06-02 DIAGNOSIS — S8991XA Unspecified injury of right lower leg, initial encounter: Secondary | ICD-10-CM | POA: Diagnosis not present

## 2021-06-02 DIAGNOSIS — S59901A Unspecified injury of right elbow, initial encounter: Secondary | ICD-10-CM | POA: Insufficient documentation

## 2021-06-02 DIAGNOSIS — S6991XA Unspecified injury of right wrist, hand and finger(s), initial encounter: Secondary | ICD-10-CM | POA: Insufficient documentation

## 2021-06-02 DIAGNOSIS — Y92009 Unspecified place in unspecified non-institutional (private) residence as the place of occurrence of the external cause: Secondary | ICD-10-CM | POA: Diagnosis not present

## 2021-06-02 DIAGNOSIS — M25561 Pain in right knee: Secondary | ICD-10-CM

## 2021-06-02 DIAGNOSIS — W010XXA Fall on same level from slipping, tripping and stumbling without subsequent striking against object, initial encounter: Secondary | ICD-10-CM | POA: Insufficient documentation

## 2021-06-02 DIAGNOSIS — M25531 Pain in right wrist: Secondary | ICD-10-CM | POA: Insufficient documentation

## 2021-06-02 DIAGNOSIS — M25521 Pain in right elbow: Secondary | ICD-10-CM

## 2021-06-02 DIAGNOSIS — Z87891 Personal history of nicotine dependence: Secondary | ICD-10-CM | POA: Insufficient documentation

## 2021-06-02 DIAGNOSIS — W19XXXA Unspecified fall, initial encounter: Secondary | ICD-10-CM

## 2021-06-02 MED ORDER — OXYCODONE-ACETAMINOPHEN 5-325 MG PO TABS: 1.0000 | ORAL_TABLET | ORAL | 0 refills | Status: DC | PRN

## 2021-06-02 MED ORDER — CYCLOBENZAPRINE HCL 10 MG PO TABS: 10.0000 mg | ORAL_TABLET | Freq: Two times a day (BID) | ORAL | 0 refills | Status: DC | PRN

## 2021-06-02 MED ORDER — OXYCODONE-ACETAMINOPHEN 5-325 MG PO TABS
1.0000 | ORAL_TABLET | ORAL | Status: DC | PRN
  Administered 2021-06-02: 1 via ORAL
  Filled 2021-06-02: qty 1
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
## 2021-06-02 NOTE — Discharge Instructions (Signed)
Your history, exam and work-up today are consistent with likely musculoskeletal and soft tissue injuries from your fall.  The x-ray did not show evidence of acute fractures however as we discussed, there can be the possibility of occult or hidden fractures.  Please follow-up with your orthopedics team and use the pain medicine and muscle medicine help with symptoms.  Please rest and stay hydrated.  If any symptoms change or worsen, please return to the nearest emergency department.
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
## 2021-06-02 NOTE — ED Notes (Signed)
Per patient the oxycodone has helped with the pain

## 2021-06-03 NOTE — ED Notes (Signed)
Pt verbalizes understanding of discharge instructions. Opportunity for questioning and answers were provided. [place] removed by staff, pt discharged from ED to home. Educated to pick up Rx.  

## 2021-06-04 ENCOUNTER — Other Ambulatory Visit: Payer: Self-pay

## 2021-06-04 DIAGNOSIS — R928 Other abnormal and inconclusive findings on diagnostic imaging of breast: Secondary | ICD-10-CM

## 2021-06-10 ENCOUNTER — Telehealth: Payer: Self-pay

## 2021-06-10 ENCOUNTER — Other Ambulatory Visit (HOSPITAL_BASED_OUTPATIENT_CLINIC_OR_DEPARTMENT_OTHER): Payer: Self-pay

## 2021-06-10 NOTE — Telephone Encounter (Signed)
After multiple attempts to reach patient, patient was called and results were discussed.  Patient to follow up with PCP for further evaluation/management. All questions answered.

## 2021-06-12 ENCOUNTER — Ambulatory Visit
Admission: RE | Admit: 2021-06-12 | Discharge: 2021-06-12 | Disposition: A | Discharge status: Home / Self Care | Payer: Medicaid Other | Source: Ambulatory Visit

## 2021-06-12 ENCOUNTER — Other Ambulatory Visit: Payer: Self-pay

## 2021-06-12 DIAGNOSIS — R928 Other abnormal and inconclusive findings on diagnostic imaging of breast: Secondary | ICD-10-CM

## 2021-06-12 DIAGNOSIS — N6310 Unspecified lump in the right breast, unspecified quadrant: Secondary | ICD-10-CM

## 2021-06-17 ENCOUNTER — Other Ambulatory Visit: Payer: Self-pay | Admitting: Sports Medicine

## 2021-06-17 ENCOUNTER — Other Ambulatory Visit (HOSPITAL_COMMUNITY): Payer: Self-pay | Admitting: Sports Medicine

## 2021-06-17 DIAGNOSIS — M25561 Pain in right knee: Secondary | ICD-10-CM

## 2021-06-17 DIAGNOSIS — M25562 Pain in left knee: Secondary | ICD-10-CM

## 2021-06-19 ENCOUNTER — Other Ambulatory Visit: Payer: Self-pay

## 2021-06-20 ENCOUNTER — Other Ambulatory Visit: Payer: Self-pay

## 2021-06-21 ENCOUNTER — Other Ambulatory Visit (HOSPITAL_COMMUNITY): Payer: Self-pay | Admitting: Psychiatry

## 2021-06-21 ENCOUNTER — Other Ambulatory Visit: Payer: Self-pay

## 2021-06-21 ENCOUNTER — Ambulatory Visit
Admission: RE | Admit: 2021-06-21 | Discharge: 2021-06-21 | Disposition: A | Discharge status: Home / Self Care | Payer: Medicaid Other | Source: Ambulatory Visit

## 2021-06-21 DIAGNOSIS — N6310 Unspecified lump in the right breast, unspecified quadrant: Secondary | ICD-10-CM

## 2021-06-21 DIAGNOSIS — F3162 Bipolar disorder, current episode mixed, moderate: Secondary | ICD-10-CM

## 2021-06-24 ENCOUNTER — Other Ambulatory Visit: Payer: Medicaid Other

## 2021-06-26 ENCOUNTER — Ambulatory Visit (HOSPITAL_COMMUNITY): Admission: RE | Admit: 2021-06-26 | Payer: Medicaid Other | Source: Ambulatory Visit

## 2021-06-26 ENCOUNTER — Encounter (HOSPITAL_COMMUNITY): Payer: Self-pay

## 2021-07-03 ENCOUNTER — Encounter (HOSPITAL_COMMUNITY): Payer: Self-pay | Admitting: Psychiatry

## 2021-07-03 ENCOUNTER — Telehealth (INDEPENDENT_AMBULATORY_CARE_PROVIDER_SITE_OTHER): Payer: Medicaid Other | Admitting: Psychiatry

## 2021-07-03 DIAGNOSIS — F3162 Bipolar disorder, current episode mixed, moderate: Secondary | ICD-10-CM | POA: Diagnosis not present

## 2021-07-03 MED ORDER — GABAPENTIN 300 MG PO CAPS: 300.0000 mg | ORAL_CAPSULE | Freq: Three times a day (TID) | ORAL | 3 refills | Status: DC

## 2021-07-03 MED ORDER — TRAZODONE HCL 150 MG PO TABS: 150.0000 mg | ORAL_TABLET | Freq: Every day | ORAL | 3 refills | Status: DC

## 2021-07-03 MED ORDER — QUETIAPINE FUMARATE 300 MG PO TABS: 300.0000 mg | ORAL_TABLET | Freq: Every day | ORAL | 3 refills | Status: DC
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
## 2021-07-08 ENCOUNTER — Ambulatory Visit: Payer: Self-pay | Admitting: Surgery

## 2021-07-08 DIAGNOSIS — N6313 Unspecified lump in the right breast, lower outer quadrant: Secondary | ICD-10-CM

## 2021-07-08 DIAGNOSIS — N6310 Unspecified lump in the right breast, unspecified quadrant: Secondary | ICD-10-CM

## 2021-07-10 ENCOUNTER — Other Ambulatory Visit: Payer: Self-pay

## 2021-07-10 ENCOUNTER — Ambulatory Visit (HOSPITAL_COMMUNITY)
Admission: RE | Admit: 2021-07-10 | Discharge: 2021-07-10 | Disposition: A | Discharge status: Home / Self Care | Payer: Medicaid Other | Source: Ambulatory Visit | Attending: Sports Medicine | Admitting: Sports Medicine

## 2021-07-10 DIAGNOSIS — M25562 Pain in left knee: Secondary | ICD-10-CM

## 2021-07-10 DIAGNOSIS — M25561 Pain in right knee: Secondary | ICD-10-CM | POA: Diagnosis present

## 2021-07-15 ENCOUNTER — Other Ambulatory Visit: Payer: Self-pay | Admitting: Surgery

## 2021-07-15 DIAGNOSIS — N6310 Unspecified lump in the right breast, unspecified quadrant: Secondary | ICD-10-CM

## 2021-09-02 ENCOUNTER — Encounter (HOSPITAL_BASED_OUTPATIENT_CLINIC_OR_DEPARTMENT_OTHER): Payer: Self-pay | Admitting: Surgery

## 2021-09-02 ENCOUNTER — Other Ambulatory Visit: Payer: Self-pay

## 2021-09-09 NOTE — Progress Notes (Signed)

## 2021-09-10 ENCOUNTER — Other Ambulatory Visit: Payer: Self-pay | Admitting: Surgery

## 2021-09-10 ENCOUNTER — Ambulatory Visit
Admission: RE | Admit: 2021-09-10 | Discharge: 2021-09-10 | Disposition: A | Discharge status: Home / Self Care | Payer: Medicaid Other | Source: Ambulatory Visit | Attending: Surgery | Admitting: Surgery

## 2021-09-10 DIAGNOSIS — N6313 Unspecified lump in the right breast, lower outer quadrant: Secondary | ICD-10-CM

## 2021-09-11 ENCOUNTER — Encounter (HOSPITAL_BASED_OUTPATIENT_CLINIC_OR_DEPARTMENT_OTHER)
Admission: RE | Disposition: A | Discharge status: Home / Self Care | Payer: Self-pay | Source: Home / Self Care | Attending: Surgery

## 2021-09-11 ENCOUNTER — Encounter (HOSPITAL_BASED_OUTPATIENT_CLINIC_OR_DEPARTMENT_OTHER): Payer: Self-pay | Admitting: Surgery

## 2021-09-11 ENCOUNTER — Ambulatory Visit
Admission: RE | Admit: 2021-09-11 | Discharge: 2021-09-11 | Disposition: A | Discharge status: Home / Self Care | Payer: Medicaid Other | Source: Ambulatory Visit | Attending: Surgery | Admitting: Surgery

## 2021-09-11 ENCOUNTER — Other Ambulatory Visit: Payer: Self-pay

## 2021-09-11 ENCOUNTER — Ambulatory Visit (HOSPITAL_BASED_OUTPATIENT_CLINIC_OR_DEPARTMENT_OTHER): Payer: Medicaid Other | Admitting: Certified Registered"

## 2021-09-11 ENCOUNTER — Ambulatory Visit (HOSPITAL_BASED_OUTPATIENT_CLINIC_OR_DEPARTMENT_OTHER)
Admission: RE | Admit: 2021-09-11 | Discharge: 2021-09-11 | Disposition: A | Discharge status: Home / Self Care | Payer: Medicaid Other | Attending: Surgery | Admitting: Surgery

## 2021-09-11 DIAGNOSIS — N6489 Other specified disorders of breast: Secondary | ICD-10-CM | POA: Diagnosis not present

## 2021-09-11 DIAGNOSIS — Z6841 Body Mass Index (BMI) 40.0 and over, adult: Secondary | ICD-10-CM | POA: Diagnosis not present

## 2021-09-11 DIAGNOSIS — N6310 Unspecified lump in the right breast, unspecified quadrant: Secondary | ICD-10-CM

## 2021-09-11 DIAGNOSIS — N6311 Unspecified lump in the right breast, upper outer quadrant: Secondary | ICD-10-CM | POA: Insufficient documentation

## 2021-09-11 DIAGNOSIS — Z87891 Personal history of nicotine dependence: Secondary | ICD-10-CM | POA: Diagnosis not present

## 2021-09-11 DIAGNOSIS — N6313 Unspecified lump in the right breast, lower outer quadrant: Secondary | ICD-10-CM

## 2021-09-11 DIAGNOSIS — N6021 Fibroadenosis of right breast: Secondary | ICD-10-CM | POA: Insufficient documentation

## 2021-09-11 DIAGNOSIS — D241 Benign neoplasm of right breast: Secondary | ICD-10-CM | POA: Diagnosis not present

## 2021-09-11 DIAGNOSIS — F319 Bipolar disorder, unspecified: Secondary | ICD-10-CM | POA: Diagnosis not present

## 2021-09-11 HISTORY — PX: BREAST LUMPECTOMY WITH RADIOACTIVE SEED LOCALIZATION: SHX6424

## 2021-09-11 SURGERY — BREAST LUMPECTOMY WITH RADIOACTIVE SEED LOCALIZATION
Anesthesia: General | Site: Breast | Laterality: Right

## 2021-09-11 MED ORDER — FENTANYL CITRATE (PF) 100 MCG/2ML IJ SOLN
INTRAMUSCULAR | Status: DC | PRN
  Administered 2021-09-11: 25 ug via INTRAVENOUS
  Administered 2021-09-11: 50 ug via INTRAVENOUS
  Administered 2021-09-11: 25 ug via INTRAVENOUS

## 2021-09-11 MED ORDER — FENTANYL CITRATE (PF) 100 MCG/2ML IJ SOLN
INTRAMUSCULAR | Status: AC
  Filled 2021-09-11: qty 2

## 2021-09-11 MED ORDER — MIDAZOLAM HCL 5 MG/5ML IJ SOLN
INTRAMUSCULAR | Status: DC | PRN
  Administered 2021-09-11: 2 mg via INTRAVENOUS

## 2021-09-11 MED ORDER — HYDROMORPHONE HCL 1 MG/ML IJ SOLN
0.2500 mg | INTRAMUSCULAR | Status: DC | PRN
  Administered 2021-09-11: 11:00:00 0.5 mg via INTRAVENOUS

## 2021-09-11 MED ORDER — ONDANSETRON HCL 4 MG/2ML IJ SOLN
INTRAMUSCULAR | Status: DC | PRN
Start: 2021-09-11 — End: 2021-09-11
  Administered 2021-09-11: 4 mg via INTRAVENOUS

## 2021-09-11 MED ORDER — OXYCODONE HCL 5 MG/5ML PO SOLN: 5.0000 mg | Freq: Once | ORAL | Status: AC | PRN

## 2021-09-11 MED ORDER — LIDOCAINE 2% (20 MG/ML) 5 ML SYRINGE
INTRAMUSCULAR | Status: DC | PRN
  Administered 2021-09-11: 60 mg via INTRAVENOUS

## 2021-09-11 MED ORDER — CEFAZOLIN IN SODIUM CHLORIDE 3-0.9 GM/100ML-% IV SOLN
3.0000 g | INTRAVENOUS | Status: AC
  Administered 2021-09-11: 10:00:00 3 g via INTRAVENOUS

## 2021-09-11 MED ORDER — OXYCODONE HCL 5 MG PO TABS
5.0000 mg | ORAL_TABLET | Freq: Once | ORAL | Status: AC | PRN
  Administered 2021-09-11: 12:00:00 5 mg via ORAL

## 2021-09-11 MED ORDER — ACETAMINOPHEN 500 MG PO TABS
ORAL_TABLET | ORAL | Status: AC
  Filled 2021-09-11: qty 2

## 2021-09-11 MED ORDER — HYDROMORPHONE HCL 1 MG/ML IJ SOLN
INTRAMUSCULAR | Status: AC
  Filled 2021-09-11: qty 0.5

## 2021-09-11 MED ORDER — PROPOFOL 10 MG/ML IV BOLUS
INTRAVENOUS | Status: DC | PRN
  Administered 2021-09-11: 200 mg via INTRAVENOUS

## 2021-09-11 MED ORDER — CHLORHEXIDINE GLUCONATE CLOTH 2 % EX PADS: 6.0000 | MEDICATED_PAD | Freq: Once | CUTANEOUS | Status: DC

## 2021-09-11 MED ORDER — MIDAZOLAM HCL 2 MG/2ML IJ SOLN
INTRAMUSCULAR | Status: AC
  Filled 2021-09-11: qty 2

## 2021-09-11 MED ORDER — OXYCODONE HCL 5 MG PO TABS
ORAL_TABLET | ORAL | Status: AC
  Filled 2021-09-11: qty 1

## 2021-09-11 MED ORDER — PROMETHAZINE HCL 25 MG/ML IJ SOLN: 6.2500 mg | INTRAMUSCULAR | Status: DC | PRN

## 2021-09-11 MED ORDER — AMISULPRIDE (ANTIEMETIC) 5 MG/2ML IV SOLN: 10.0000 mg | Freq: Once | INTRAVENOUS | Status: DC | PRN

## 2021-09-11 MED ORDER — CEFAZOLIN IN SODIUM CHLORIDE 3-0.9 GM/100ML-% IV SOLN
INTRAVENOUS | Status: AC
  Filled 2021-09-11: qty 100

## 2021-09-11 MED ORDER — SODIUM CHLORIDE 0.9 % IV SOLN
INTRAVENOUS | Status: DC | PRN
  Administered 2021-09-11: 11:00:00 500 mL

## 2021-09-11 MED ORDER — OXYCODONE HCL 5 MG PO TABS: 5.0000 mg | ORAL_TABLET | Freq: Four times a day (QID) | ORAL | 0 refills | Status: DC | PRN

## 2021-09-11 MED ORDER — BUPIVACAINE-EPINEPHRINE (PF) 0.25% -1:200000 IJ SOLN
INTRAMUSCULAR | Status: DC | PRN
  Administered 2021-09-11: 23 mL

## 2021-09-11 MED ORDER — ACETAMINOPHEN 500 MG PO TABS: 1000.0000 mg | ORAL_TABLET | ORAL | Status: DC

## 2021-09-11 MED ORDER — MEPERIDINE HCL 25 MG/ML IJ SOLN: 6.2500 mg | INTRAMUSCULAR | Status: DC | PRN

## 2021-09-11 MED ORDER — PROPOFOL 500 MG/50ML IV EMUL
INTRAVENOUS | Status: DC | PRN
Start: 2021-09-11 — End: 2021-09-11
  Administered 2021-09-11: 25 ug/kg/min via INTRAVENOUS

## 2021-09-11 MED ORDER — DEXAMETHASONE SODIUM PHOSPHATE 4 MG/ML IJ SOLN
INTRAMUSCULAR | Status: DC | PRN
  Administered 2021-09-11: 4 mg via INTRAVENOUS

## 2021-09-11 MED ORDER — LACTATED RINGERS IV SOLN: INTRAVENOUS | Status: DC

## 2021-09-11 SURGICAL SUPPLY — 53 items
ADH SKN CLS APL DERMABOND .7 (GAUZE/BANDAGES/DRESSINGS) ×1
APL PRP STRL LF DISP 70% ISPRP (MISCELLANEOUS) ×1
APPLIER CLIP 9.375 MED OPEN (MISCELLANEOUS)
APR CLP MED 9.3 20 MLT OPN (MISCELLANEOUS)
BAG DECANTER FOR FLEXI CONT (MISCELLANEOUS) ×1 IMPLANT
BINDER BREAST LRG (GAUZE/BANDAGES/DRESSINGS) IMPLANT
BINDER BREAST MEDIUM (GAUZE/BANDAGES/DRESSINGS) IMPLANT
BINDER BREAST XLRG (GAUZE/BANDAGES/DRESSINGS) IMPLANT
BINDER BREAST XXLRG (GAUZE/BANDAGES/DRESSINGS) IMPLANT
BLADE SURG 15 STRL LF DISP TIS (BLADE) ×1 IMPLANT
BLADE SURG 15 STRL SS (BLADE) ×2
CANISTER SUC SOCK COL 7IN (MISCELLANEOUS) ×1 IMPLANT
CANISTER SUCT 1200ML W/VALVE (MISCELLANEOUS) ×1 IMPLANT
CHLORAPREP W/TINT 26 (MISCELLANEOUS) ×2 IMPLANT
CLIP APPLIE 9.375 MED OPEN (MISCELLANEOUS) IMPLANT
COVER BACK TABLE 60X90IN (DRAPES) ×2 IMPLANT
COVER MAYO STAND STRL (DRAPES) ×2 IMPLANT
COVER PROBE W GEL 5X96 (DRAPES) ×2 IMPLANT
DECANTER SPIKE VIAL GLASS SM (MISCELLANEOUS) IMPLANT
DERMABOND ADVANCED (GAUZE/BANDAGES/DRESSINGS) ×1
DERMABOND ADVANCED .7 DNX12 (GAUZE/BANDAGES/DRESSINGS) ×1 IMPLANT
DRAPE LAPAROSCOPIC ABDOMINAL (DRAPES) ×1 IMPLANT
DRAPE LAPAROTOMY 100X72 PEDS (DRAPES) ×1 IMPLANT
DRAPE UTILITY XL STRL (DRAPES) ×2 IMPLANT
ELECT COATED BLADE 2.86 ST (ELECTRODE) ×2 IMPLANT
ELECT REM PT RETURN 9FT ADLT (ELECTROSURGICAL) ×2
ELECTRODE REM PT RTRN 9FT ADLT (ELECTROSURGICAL) ×1 IMPLANT
GLOVE SRG 8 PF TXTR STRL LF DI (GLOVE) ×1 IMPLANT
GLOVE SURG LTX SZ8 (GLOVE) ×2 IMPLANT
GLOVE SURG UNDER POLY LF SZ7 (GLOVE) ×2 IMPLANT
GLOVE SURG UNDER POLY LF SZ8 (GLOVE) ×2
GOWN STRL REUS W/ TWL LRG LVL3 (GOWN DISPOSABLE) ×2 IMPLANT
GOWN STRL REUS W/ TWL XL LVL3 (GOWN DISPOSABLE) ×1 IMPLANT
GOWN STRL REUS W/TWL LRG LVL3 (GOWN DISPOSABLE) ×2
GOWN STRL REUS W/TWL XL LVL3 (GOWN DISPOSABLE)
HEMOSTAT ARISTA ABSORB 3G PWDR (HEMOSTASIS) IMPLANT
HEMOSTAT SNOW SURGICEL 2X4 (HEMOSTASIS) IMPLANT
KIT MARKER MARGIN INK (KITS) ×2 IMPLANT
NDL HYPO 25X1 1.5 SAFETY (NEEDLE) ×1 IMPLANT
NEEDLE HYPO 25X1 1.5 SAFETY (NEEDLE) ×2 IMPLANT
NS IRRIG 1000ML POUR BTL (IV SOLUTION) ×1 IMPLANT
PACK BASIN DAY SURGERY FS (CUSTOM PROCEDURE TRAY) ×2 IMPLANT
PENCIL SMOKE EVACUATOR (MISCELLANEOUS) ×2 IMPLANT
SLEEVE SCD COMPRESS KNEE MED (STOCKING) ×2 IMPLANT
SPONGE T-LAP 4X18 ~~LOC~~+RFID (SPONGE) ×2 IMPLANT
SUT MNCRL AB 4-0 PS2 18 (SUTURE) ×3 IMPLANT
SUT SILK 2 0 SH (SUTURE) IMPLANT
SUT VICRYL 3-0 CR8 SH (SUTURE) ×2 IMPLANT
SYR CONTROL 10ML LL (SYRINGE) ×2 IMPLANT
TOWEL GREEN STERILE FF (TOWEL DISPOSABLE) ×2 IMPLANT
TRAY FAXITRON CT DISP (TRAY / TRAY PROCEDURE) ×2 IMPLANT
TUBE CONNECTING 20X1/4 (TUBING) ×1 IMPLANT
YANKAUER SUCT BULB TIP NO VENT (SUCTIONS) ×1 IMPLANT

## 2021-09-11 NOTE — Transfer of Care (Signed)
Immediate Anesthesia Transfer of Care Note  Patient: Jeanette Hopkins  Procedure(s) Performed: RIGHT BREAST LUMPECTOMY WITH RADIOACTIVE SEED LOCALIZATION X2 (Right: Breast)  Patient Location: PACU  Anesthesia Type:General  Level of Consciousness: drowsy and patient cooperative  Airway & Oxygen Therapy: Patient Spontanous Breathing and Patient connected to face mask oxygen  Post-op Assessment: Report given to RN and Post -op Vital signs reviewed and stable  Post vital signs: Reviewed and stable  Last Vitals:  Vitals Value Taken Time  BP    Temp    Pulse 77 09/11/21 1051  Resp    SpO2 100 % 09/11/21 1051  Vitals shown include unvalidated device data.  Last Pain:  Vitals:   09/11/21 0756  TempSrc: Oral  PainSc: 0-No pain         Complications: No notable events documented.

## 2021-09-11 NOTE — Op Note (Signed)
Preoperative diagnosis: Right breast mass x2 upper outer quadrant ° °Postoperative diagnosis: Same ° °Procedure: Right breast seed localized lumpectomy x2 ° °Surgeon:  , MD ° °Anesthesia: General with 0.25% Marcaine plain ° °EBL: 10 cc ° °Drains: None ° °Indications for procedure: The patient is a 50-year-old female with 2 masses in her right breast.  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  We discussed excision of the radial scar due to potential upgrade risk to malignancy.  She also and the fibroadenoma removed same time.  These were basically in the upper outer quadrant together.  Recommended 2 seeds removed both areas.  Risks and benefits of surgery discussed.  Observation discussed.  Complications of surgery discussed.The procedure has been discussed with the patient. Alternatives to surgery have been discussed with the patient.  Risks of surgery include bleeding,  Infection,  Seroma formation, death,  and the need for further surgery.   The patient understands and wishes to proceed.  ° ° ° °Description of procedure: The patient was met in the holding area and questions were answered.  Films were available for review.  Right breast was marked as the correct site.  She was then taken the operative room.  She was placed supine upon the OR table.  After induction of general anesthesia, right breast was prepped and draped in a sterile fashion and timeout performed.  Proper patient, site and procedure verified.  Neoprobe was used and seizure localized right upper outer quadrant.  Curvilinear incision was made in the right upper outer quadrant.  Dissection was carried down and both signals were identified.  These were within them 4 cm of each other so it look like.  Both lesions were excised as 1 specimen.  The images revealed both seeds and both clips to be in specimen.  Specimen was oriented with ink.  It was then passed off the field.  The cavities made hemostatic with cautery.   Irrigation was used.  Local anesthetic was infiltrated in the cavity.  Exam closed with a deep layer of 3-0 Vicryl and 4-0 Monocryl.  Dermabond was applied.  Breast binder placed.  All counts found to be correct.  The patient was then awoke extubated taken to recovery in satisfactory condition. °

## 2021-09-11 NOTE — Interval H&P Note (Signed)
History and Physical Interval Note:  09/11/2021 9:25 AM  Jeanette Hopkins  has presented today for surgery, with the diagnosis of RIGHT BREAST MASS.  The various methods of treatment have been discussed with the patient and family. After consideration of risks, benefits and other options for treatment, the patient has consented to  Procedure(s): RIGHT BREAST LUMPECTOMY WITH RADIOACTIVE SEED LOCALIZATION X2 (Right) as a surgical intervention.  The patient's history has been reviewed, patient examined, no change in status, stable for surgery.  I have reviewed the patient's chart and labs.  Questions were answered to the patient's satisfaction.     Samson

## 2021-09-11 NOTE — Anesthesia Postprocedure Evaluation (Signed)
Anesthesia Post Note  Patient: Jeanette Hopkins  Procedure(s) Performed: RIGHT BREAST LUMPECTOMY WITH RADIOACTIVE SEED LOCALIZATION X2 (Right: Breast)     Patient location during evaluation: PACU Anesthesia Type: General Level of consciousness: awake and alert Pain management: pain level controlled Vital Signs Assessment: post-procedure vital signs reviewed and stable Respiratory status: spontaneous breathing, nonlabored ventilation and respiratory function stable Cardiovascular status: blood pressure returned to baseline and stable Postop Assessment: no apparent nausea or vomiting Anesthetic complications: no   No notable events documented.  Last Vitals:  Vitals:   09/11/21 1145 09/11/21 1202  BP: (!) 147/93 (!) 143/97  Pulse: 65 71  Resp: 16 16  Temp:  36.4 C  SpO2: 95% 96%    Last Pain:  Vitals:   09/11/21 1202  TempSrc: Oral  PainSc: 4                  Lynda Rainwater

## 2021-09-11 NOTE — Anesthesia Procedure Notes (Signed)
Procedure Name: LMA Insertion Date/Time: 09/11/2021 9:51 AM Performed by: Signe Colt, CRNA Pre-anesthesia Checklist: Patient identified, Emergency Drugs available, Suction available and Patient being monitored Patient Re-evaluated:Patient Re-evaluated prior to induction Oxygen Delivery Method: Circle System Utilized Preoxygenation: Pre-oxygenation with 100% oxygen Induction Type: IV induction Ventilation: Mask ventilation without difficulty LMA: LMA inserted LMA Size: 4.0 Number of attempts: 1 Airway Equipment and Method: bite block Placement Confirmation: positive ETCO2 Tube secured with: Tape Dental Injury: Teeth and Oropharynx as per pre-operative assessment

## 2021-09-11 NOTE — H&P (Signed)
History of Present Illness: Jeanette Hopkins is a 51 y.o. female who is seen today as an office consultation at the request of Dr. Jeanie Cooks for evaluation of Breast Mass .   Patient noted to have 2 right breast cyst on recent screening mammogram. Both were biopsied. Location is right upper outer quadrant. 1 was a complex sclerosing lesion and the second was a fibroadenoma. No family history of breast cancer. The patient denies breast pain, nipple discharge or change in the appearance of the breast.  Review of Systems: A complete review of systems was obtained from the patient. I have reviewed this information and discussed as appropriate with the patient. See HPI as well for other ROS.    Medical History: Past Medical History:  Diagnosis Date   Anemia   There is no problem list on file for this patient.  History reviewed. No pertinent surgical history.   No Known Allergies  Current Outpatient Medications on File Prior to Visit  Medication Sig Dispense Refill   QUEtiapine (SEROQUEL) 300 MG tablet Take by mouth   traZODone (DESYREL) 150 MG tablet Take by mouth   No current facility-administered medications on file prior to visit.   History reviewed. No pertinent family history.   Social History   Tobacco Use  Smoking Status Never  Smokeless Tobacco Never    Social History   Socioeconomic History   Marital status: Single  Tobacco Use   Smoking status: Never   Smokeless tobacco: Never  Substance and Sexual Activity   Alcohol use: Never   Drug use: Never   Objective:   Vitals:  07/08/21 1056  BP: 122/84  Pulse: 94  Weight: (!) 125.1 kg (275 lb 12.8 oz)  Height: 172.7 cm (5\' 8" )   Body mass index is 41.94 kg/m.  Physical Exam Constitutional:  Appearance: Normal appearance.  HENT:  Head: Normocephalic.  Eyes:  General: No scleral icterus. Pupils: Pupils are equal, round, and reactive to light.  Cardiovascular:  Rate and Rhythm: Normal rate.  Pulmonary:   Effort: Pulmonary effort is normal.  Breath sounds: No stridor.  Chest:  Breasts: Right: Normal. No nipple discharge.  Left: Normal. No nipple discharge.  Musculoskeletal:  General: Normal range of motion.  Cervical back: Normal range of motion.  Lymphadenopathy:  Upper Body:  Right upper body: No supraclavicular or axillary adenopathy.  Left upper body: No supraclavicular or axillary adenopathy.  Skin: General: Skin is warm.  Neurological:  General: No focal deficit present.  Mental Status: She is alert.  Psychiatric:  Mood and Affect: Mood normal.  Behavior: Behavior normal.     Labs, Imaging and Diagnostic Testing: Diagnosis 1. Breast, right, needle core biopsy, 8 o'clock, (ribbon) - FIBROADENOMA WITH PSEUDOANGIOMATOUS STROMAL HYPERPLASIA - NO MALIGNANCY IDENTIFIED 2. Breast, right, needle core biopsy, 9 o'clock, (coil) - COMPLEX SCLEROSING LESION WITH INTRADUCTAL PAPILLOMA - SEE COMMENT Microscopic Comment 1. These results were called to [facility] on June 24, 2021.  Assessment and Plan:  Diagnoses and all orders for this visit:  Mass of upper outer quadrant of right breast    Discussed the pros and cons of lumpectomy. Patient desires both masses to be removed at the same time. I discussed the risk of radial scars and malignancy of being somewhere between 5% and 20% depending on the literature. The fibroadenoma does not need to be removed but the patient desires removal due to discomfort. Plan will be for right breast seed lumpectomy x2.The procedure has been discussed with  the patient. Alternatives to surgery have been discussed with the patient. Risks of surgery include bleeding, Infection, Seroma formation, death, and the need for further surgery. The patient understands and wishes to proceed.  No follow-ups on file.  Kennieth Francois, MD

## 2021-09-11 NOTE — Discharge Instructions (Addendum)
Post Anesthesia Home Care Instructions  Activity: Get plenty of rest for the remainder of the day. A responsible individual must stay with you for 24 hours following the procedure.  For the next 24 hours, DO NOT: -Drive a car -Paediatric nurse -Drink alcoholic beverages -Take any medication unless instructed by your physician -Make any legal decisions or sign important papers.  Meals: Start with liquid foods such as gelatin or soup. Progress to regular foods as tolerated. Avoid greasy, spicy, heavy foods. If nausea and/or vomiting occur, drink only clear liquids until the nausea and/or vomiting subsides. Call your physician if vomiting continues.  Special Instructions/Symptoms: Your throat may feel dry or sore from the anesthesia or the breathing tube placed in your throat during surgery. If this causes discomfort, gargle with warm salt water. The discomfort should disappear within 24 hours.  If you had a scopolamine patch placed behind your ear for the management of post- operative nausea and/or vomiting:  1. The medication in the patch is effective for 72 hours, after which it should be removed.  Wrap patch in a tissue and discard in the trash. Wash hands thoroughly with soap and water. 2. You may remove the patch earlier than 72 hours if you experience unpleasant side effects which may include dry mouth, dizziness or visual disturbances. 3. Avoid touching the patch. Wash your hands with soap and water after contact with the patch.    [place] Office Phone Number [phone]  BREAST BIOPSY/ PARTIAL MASTECTOMY: POST OP INSTRUCTIONS  Always review your discharge instruction sheet given to you by the facility where your surgery was performed.  IF YOU HAVE DISABILITY OR FAMILY LEAVE FORMS, YOU MUST BRING THEM TO THE OFFICE FOR PROCESSING.  DO NOT GIVE THEM TO YOUR DOCTOR.  A prescription for pain medication may be given to you upon discharge.  Take your pain  medication as prescribed, if needed.  If narcotic pain medicine is not needed, then you may take acetaminophen (Tylenol) or ibuprofen (Advil) as needed. Take your usually prescribed medications unless otherwise directed If you need a refill on your pain medication, please contact your pharmacy.  They will contact our office to request authorization.  Prescriptions will not be filled after 5pm or on week-ends. You should eat very light the first 24 hours after surgery, such as soup, crackers, pudding, etc.  Resume your normal diet the day after surgery. Most patients will experience some swelling and bruising in the breast.  Ice packs and a good support bra will help.  Swelling and bruising can take several days to resolve.  It is common to experience some constipation if taking pain medication after surgery.  Increasing fluid intake and taking a stool softener will usually help or prevent this problem from occurring.  A mild laxative (Milk of Magnesia or Miralax) should be taken according to package directions if there are no bowel movements after 48 hours. Unless discharge instructions indicate otherwise, you may remove your bandages 24-48 hours after surgery, and you may shower at that time.  You may have steri-strips (small skin tapes) in place directly over the incision.  These strips should be left on the skin for 7-10 days.  If your surgeon used skin glue on the incision, you may shower in 24 hours.  The glue will flake off over the next 2-3 weeks.  Any sutures or staples will be removed at the office during your follow-up visit. ACTIVITIES:  You may resume regular daily activities (gradually increasing)  beginning the next day.  Wearing a good support bra or sports bra minimizes pain and swelling.  You may have sexual intercourse when it is comfortable. You may drive when you no longer are taking prescription pain medication, you can comfortably wear a seatbelt, and you can safely maneuver your car and  apply brakes. RETURN TO WORK:  ______________________________________________________________________________________ Dennis Bast should see your doctor in the office for a follow-up appointment approximately two weeks after your surgery.  Your doctors nurse will typically make your follow-up appointment when she calls you with your pathology report.  Expect your pathology report 2-3 business days after your surgery.  You may call to check if you do not hear from [place] after three days. OTHER INSTRUCTIONS: _______________________________________________________________________________________________ _____________________________________________________________________________________________________________________________________ _____________________________________________________________________________________________________________________________________ _____________________________________________________________________________________________________________________________________  WHEN TO CALL YOUR DOCTOR: Fever over 101.0 Nausea and/or vomiting. Extreme swelling or bruising. Continued bleeding from incision. Increased pain, redness, or drainage from the incision.  The clinic staff is available to answer your questions during regular business hours.  Please dont hesitate to call and ask to speak to one of the nurses for clinical concerns.  If you have a medical emergency, go to the nearest emergency room or call 911.  A surgeon from [facility] is always on call at the hospital.  For further questions, please visit [email or web address]

## 2021-09-11 NOTE — Anesthesia Preprocedure Evaluation (Addendum)
Anesthesia Evaluation  Patient identified by MRN, date of birth, ID band Patient awake    Reviewed: Allergy & Precautions, NPO status , Patient's Chart, lab work & pertinent test results  Airway Mallampati: II  TM Distance: >3 FB Neck ROM: Full    Dental  (+) Edentulous Upper   Pulmonary neg pulmonary ROS, former smoker,    Pulmonary exam normal breath sounds clear to auscultation       Cardiovascular negative cardio ROS Normal cardiovascular exam Rhythm:Regular Rate:Normal     Neuro/Psych Bipolar Disorder negative neurological ROS  negative psych ROS   GI/Hepatic negative GI ROS, Neg liver ROS,   Endo/Other  Morbid obesity  Renal/GU negative Renal ROS  negative genitourinary   Musculoskeletal negative musculoskeletal ROS (+)   Abdominal (+) + obese,   Peds negative pediatric ROS (+)  Hematology negative hematology ROS (+)   Anesthesia Other Findings   Reproductive/Obstetrics negative OB ROS                            Anesthesia Physical Anesthesia Plan  ASA: 3  Anesthesia Plan: General   Post-op Pain Management:    Induction: Intravenous  PONV Risk Score and Plan: 3 and Ondansetron, Dexamethasone, Midazolam and Treatment may vary due to age or medical condition  Airway Management Planned: LMA  Additional Equipment:   Intra-op Plan:   Post-operative Plan: Extubation in OR  Informed Consent: I have reviewed the patients History and Physical, chart, labs and discussed the procedure including the risks, benefits and alternatives for the proposed anesthesia with the patient or authorized representative who has indicated his/her understanding and acceptance.     Dental advisory given  Plan Discussed with: CRNA  Anesthesia Plan Comments:         Anesthesia Quick Evaluation

## 2021-09-12 ENCOUNTER — Encounter (HOSPITAL_BASED_OUTPATIENT_CLINIC_OR_DEPARTMENT_OTHER): Payer: Self-pay | Admitting: Surgery

## 2021-09-13 LAB — SURGICAL PATHOLOGY

## 2021-09-26 ENCOUNTER — Telehealth (INDEPENDENT_AMBULATORY_CARE_PROVIDER_SITE_OTHER): Payer: Medicaid Other | Admitting: Psychiatry

## 2021-09-26 ENCOUNTER — Encounter (HOSPITAL_COMMUNITY): Payer: Self-pay | Admitting: Psychiatry

## 2021-09-26 DIAGNOSIS — F3162 Bipolar disorder, current episode mixed, moderate: Secondary | ICD-10-CM

## 2021-09-26 MED ORDER — GABAPENTIN 400 MG PO CAPS: 400.0000 mg | ORAL_CAPSULE | Freq: Three times a day (TID) | ORAL | 3 refills | Status: DC

## 2021-09-26 MED ORDER — QUETIAPINE FUMARATE 300 MG PO TABS: 300.0000 mg | ORAL_TABLET | Freq: Every day | ORAL | 3 refills | Status: DC

## 2021-09-26 MED ORDER — TRAZODONE HCL 100 MG PO TABS: 200.0000 mg | ORAL_TABLET | Freq: Every day | ORAL | 3 refills | Status: DC
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
## 2021-09-28 ENCOUNTER — Emergency Department (HOSPITAL_BASED_OUTPATIENT_CLINIC_OR_DEPARTMENT_OTHER)
Admission: EM | Admit: 2021-09-28 | Discharge: 2021-09-28 | Disposition: A | Discharge status: Home / Self Care | Payer: Medicaid Other | Attending: Emergency Medicine | Admitting: Emergency Medicine

## 2021-09-28 ENCOUNTER — Encounter (HOSPITAL_BASED_OUTPATIENT_CLINIC_OR_DEPARTMENT_OTHER): Payer: Self-pay | Admitting: Emergency Medicine

## 2021-09-28 ENCOUNTER — Other Ambulatory Visit: Payer: Self-pay

## 2021-09-28 DIAGNOSIS — T8131XA Disruption of external operation (surgical) wound, not elsewhere classified, initial encounter: Secondary | ICD-10-CM | POA: Diagnosis not present

## 2021-09-28 DIAGNOSIS — T8149XA Infection following a procedure, other surgical site, initial encounter: Secondary | ICD-10-CM

## 2021-09-28 DIAGNOSIS — N644 Mastodynia: Secondary | ICD-10-CM | POA: Insufficient documentation

## 2021-09-28 LAB — CBC WITH DIFFERENTIAL/PLATELET
Abs Immature Granulocytes: 0.02 10*3/uL (ref 0.00–0.07)
Basophils Absolute: 0 10*3/uL (ref 0.0–0.1)
Basophils Relative: 0 %
Eosinophils Absolute: 0 10*3/uL (ref 0.0–0.5)
Eosinophils Relative: 1 %
HCT: 39.6 % (ref 36.0–46.0)
Hemoglobin: 13.4 g/dL (ref 12.0–15.0)
Immature Granulocytes: 0 %
Lymphocytes Relative: 18 %
Lymphs Abs: 1.5 10*3/uL (ref 0.7–4.0)
MCH: 30.8 pg (ref 26.0–34.0)
MCHC: 33.8 g/dL (ref 30.0–36.0)
MCV: 91 fL (ref 80.0–100.0)
Monocytes Absolute: 0.7 10*3/uL (ref 0.1–1.0)
Monocytes Relative: 8 %
Neutro Abs: 6.1 10*3/uL (ref 1.7–7.7)
Neutrophils Relative %: 73 %
Platelets: 193 10*3/uL (ref 150–400)
RBC: 4.35 MIL/uL (ref 3.87–5.11)
RDW: 13.9 % (ref 11.5–15.5)
WBC: 8.3 10*3/uL (ref 4.0–10.5)
nRBC: 0 % (ref 0.0–0.2)

## 2021-09-28 LAB — BASIC METABOLIC PANEL
Anion gap: 6 (ref 5–15)
BUN: 16 mg/dL (ref 6–20)
CO2: 29 mmol/L (ref 22–32)
Calcium: 9.1 mg/dL (ref 8.9–10.3)
Chloride: 103 mmol/L (ref 98–111)
Creatinine, Ser: 1.36 mg/dL — ABNORMAL HIGH (ref 0.44–1.00)
GFR, Estimated: 47 mL/min — ABNORMAL LOW (ref >60)
Glucose, Bld: 83 mg/dL (ref 70–99)
Potassium: 3.7 mmol/L (ref 3.5–5.1)
Sodium: 138 mmol/L (ref 135–145)

## 2021-09-28 MED ORDER — DOXYCYCLINE HYCLATE 100 MG PO TABS
100.0000 mg | ORAL_TABLET | Freq: Once | ORAL | Status: AC
Start: 2021-09-28 — End: 2021-09-28
  Administered 2021-09-28: 100 mg via ORAL
  Filled 2021-09-28: qty 1

## 2021-09-28 MED ORDER — DOXYCYCLINE HYCLATE 100 MG PO CAPS: 100.0000 mg | ORAL_CAPSULE | Freq: Two times a day (BID) | ORAL | 0 refills | Status: DC

## 2021-09-28 MED ORDER — OXYCODONE HCL 5 MG PO TABS
5.0000 mg | ORAL_TABLET | Freq: Four times a day (QID) | ORAL | 0 refills | Status: DC | PRN
Start: 2021-09-28 — End: 2022-03-01

## 2021-09-28 MED ORDER — OXYCODONE HCL 5 MG PO TABS
5.0000 mg | ORAL_TABLET | Freq: Once | ORAL | Status: AC
  Administered 2021-09-28: 5 mg via ORAL
  Filled 2021-09-28: qty 1

## 2021-09-28 NOTE — Discharge Instructions (Signed)
Please read and follow all provided instructions.  Your diagnoses today include:  1. Wound infection after surgery     Tests performed today include: Vital signs. See below for your results today.  Complete blood cell count: Normal white blood cell count Basic metabolic panel:   Medications prescribed:  Doxycycline - antibiotic  You have been prescribed an antibiotic medicine: take the entire course of medicine even if you are feeling better. Stopping early can cause the antibiotic not to work.  Oxycodone - narcotic pain medication  DO NOT drive or perform any activities that require you to be awake and alert because this medicine can make you drowsy.   Take any prescribed medications only as directed.   Home care instructions:  Follow any educational materials contained in this packet. Keep affected area above the level of your heart when possible. Wash area gently twice a day with warm soapy water. Do not apply alcohol or hydrogen peroxide. Cover the area if it draining or weeping.   Follow-up instructions: I spoke with [facility] tonight.  You may be contacted on Monday for follow-up appointment but please call in the afternoon if you do not hear from them.  Return instructions:  Return to the Emergency Department if you have: Fever Worsening symptoms Worsening pain Worsening swelling Redness of the skin that moves away from the affected area, especially if it streaks away from the affected area  Any other emergent concerns  Your vital signs today were: BP 123/83 (BP Location: Left Arm)    Pulse 85    Temp 98.6 F (37 C) (Oral)    Resp 20    SpO2 99%  If your blood pressure (BP) was elevated above 135/85 this visit, please have this repeated by your doctor within one month. --------------

## 2021-09-28 NOTE — ED Triage Notes (Signed)
Pt states she had lumpectomy on Jan 25th on right breast. Pt states she has had pain in the right breast for 2 weeks and now its draining yellow drainage and feels hard. Pt states she may have had fevers. No nausea, intermittent diarhhea.

## 2021-09-28 NOTE — ED Notes (Signed)
Dc instructions and scripts reviewed with pt no questions or concerns at this time. Will follow up with surgeon on Monday. Pt declined wheelchair.. Ambulated without difficulty to waiting room to wait for family member to transport home.

## 2021-09-28 NOTE — ED Provider Notes (Signed)
[facility] EMERGENCY DEPT Provider Note   CSN: [id number] Arrival date & time: 09/28/21  1607     History  Chief Complaint  Patient presents with   Wound Dehiscence    Jeanette Hopkins is a 51 y.o. female.  Patient presents emergency department after having a lumpectomy on 1/25 performed by [facility] surgery for evaluation of worsening pain and drainage from her operative wound.  Patient has had worsening pain over the past 4 to 5 days.  She has developed yellow drainage.  No documented fevers.  Pain is worse with movement and she states worse now than it was after her surgery.  She was prescribed oxycodone and is now out.  No nausea, vomiting.  She has not attempted to contact her surgeon.      Home Medications Prior to Admission medications   Medication Sig Start Date End Date Taking? Authorizing Provider  cyclobenzaprine (FLEXERIL) 10 MG tablet Take 1 tablet (10 mg total) by mouth 2 (two) times daily as needed for muscle spasms. 06/02/21   Tegeler, Gwenyth Allegra, MD  gabapentin (NEURONTIN) 400 MG capsule Take 1 capsule (400 mg total) by mouth 3 (three) times daily. 09/26/21   Salley Slaughter, NP  ibuprofen (ADVIL,MOTRIN) 200 MG tablet Take 400 mg by mouth every 6 (six) hours as needed for headache or mild pain. Patient not taking: Reported on 04/02/2021    [provider]  miconazole (MICATIN) 2 % cream Apply 1 application topically 2 (two) times daily. 05/28/21   Simpson, Danielle L, CNM  naproxen (NAPROSYN) 500 MG tablet Take 500 mg by mouth 2 (two) times daily as needed. 05/23/21   [provider]  oxyCODONE (OXY IR/ROXICODONE) 5 MG immediate release tablet Take 1 tablet (5 mg total) by mouth every 6 (six) hours as needed for severe pain. 09/11/21   Cornett, Marcello Moores, MD  oxyCODONE-acetaminophen (PERCOCET/ROXICET) 5-325 MG per tablet Take 1 tablet by mouth every 4 (four) hours as needed for moderate pain or severe pain. 02/10/15   Clayton Bibles,  PA-C  oxyCODONE-acetaminophen (PERCOCET/ROXICET) 5-325 MG tablet Take 1 tablet by mouth every 4 (four) hours as needed for severe pain. 06/02/21   Tegeler, Gwenyth Allegra, MD  QUEtiapine (SEROQUEL) 300 MG tablet Take 1 tablet (300 mg total) by mouth at bedtime. 09/26/21   Salley Slaughter, NP  traMADol (ULTRAM) 50 MG tablet Take 50 mg by mouth. 05/22/21   [provider]  traZODone (DESYREL) 100 MG tablet Take 2 tablets (200 mg total) by mouth at bedtime. 09/26/21   Salley Slaughter, NP      Allergies    Patient has no known allergies.    Review of Systems   Review of Systems  Physical Exam Updated Vital Signs BP 103/77    Pulse 94    Temp 98.6 F (37 C) (Oral)    Resp 18    SpO2 94%  Physical Exam Vitals and nursing note reviewed.  Constitutional:      General: She is not in acute distress.    Appearance: She is well-developed.  HENT:     Head: Normocephalic and atraumatic.     Right Ear: External ear normal.     Left Ear: External ear normal.     Nose: Nose normal.  Eyes:     Conjunctiva/sclera: Conjunctivae normal.  Cardiovascular:     Rate and Rhythm: Normal rate and regular rhythm.     Heart sounds: No murmur heard. Pulmonary:     Effort:  No respiratory distress.     Breath sounds: No wheezing, rhonchi or rales.  Chest:     Comments: Patient with mild erythema and tenderness around her right lateral breast postop wound.  The superiormost 1 cm of the wound is dehisced.  There is yellow drainage bandage currently on the wound. Abdominal:     Palpations: Abdomen is soft.     Tenderness: There is no abdominal tenderness. There is no guarding or rebound.  Musculoskeletal:     Cervical back: Normal range of motion and neck supple.     Right lower leg: No edema.     Left lower leg: No edema.  Skin:    General: Skin is warm and dry.     Findings: No rash.  Neurological:     General: No focal deficit present.     Mental Status: She is alert. Mental status is at  baseline.     Motor: No weakness.  Psychiatric:        Mood and Affect: Mood normal.        ED Results / Procedures / Treatments   Labs (all labs ordered are listed, but only abnormal results are displayed) Labs Reviewed  CBC WITH DIFFERENTIAL/PLATELET  BASIC METABOLIC PANEL    EKG None  Radiology No results found.  Procedures Procedures    Medications Ordered in ED Medications - No data to display  ED Course/ Medical Decision Making/ A&P    Patient seen and examined. History obtained directly from patient and recent clinic and operative notes..   Labs/EKG: Ordered CBC, BMP.  Imaging: None ordered  Medications/Fluids: None ordered.  Will likely need antibiotics.  Most recent vital signs reviewed and are as follows: BP 103/77    Pulse 94    Temp 98.6 F (37 C) (Oral)    Resp 18    SpO2 94%   Initial impression: Postoperative wound infection.  Vital signs look good.  Will check labs.  Will touch base with general surgery regards to treatment and follow-up plan.  8:08 PM Reassessment performed. Patient appears comfortable.  She continues to have drainage from the breast.  I consulted with Dr. Grandville Silos by telephone with [place] surgery earlier regarding case.  Agrees that patient is appropriate for outpatient follow-up and agrees with initiation of antibiotics.  Patient given a dose of oxycodone and doxycycline in the emergency department.  Labs and imaging personally reviewed and interpreted including: CBC with normal white blood cell count.  Most current vital signs reviewed and are as follows: BP 123/83 (BP Location: Left Arm)    Pulse 85    Temp 98.6 F (37 C) (Oral)    Resp 20    SpO2 99%   Plan: Discharge to home, general surgery follow-up early next week  Home treatment: Prescription written for doxycycline, oxycodone.  Patient counseled on use of narcotic pain medications. Counseled not to combine these medications with others containing  tylenol. Urged not to drink alcohol, drive, or perform any other activities that requires focus while taking these medications. The patient verbalizes understanding and agrees with the plan.   Return and follow-up instructions: Pt urged to return with worsening pain, worsening swelling, expanding area of redness or streaking up extremity, fever, or any other concerns. Urged to take complete course of antibiotics as prescribed. Counseled to take pain medications as prescribed. Pt verbalizes understanding and agrees with plan.  Medical Decision Making Amount and/or Complexity of Data Reviewed Labs: ordered.   Patient with worsening pain and drainage from right breast wound, concerning for postoperative wound infection.  No fevers or other signs of sepsis.  White blood cell count is normal.  Touch base with general surgery and they will follow-up with patient next week.  She seems reliable to return with worsening we discussed wound care and return instructions.        Final Clinical Impression(s) / ED Diagnoses Final diagnoses:  Wound infection after surgery    Rx / DC Orders ED Discharge Orders          Ordered    doxycycline (VIBRAMYCIN) 100 MG capsule  2 times daily        09/28/21 2006    oxyCODONE (OXY IR/ROXICODONE) 5 MG immediate release tablet  Every 6 hours PRN        09/28/21 2006              Carlisle Cater, PA-C 09/28/21 2010    Curatolo, Campbell, DO 09/28/21 2121

## 2021-10-19 ENCOUNTER — Other Ambulatory Visit: Payer: Self-pay

## 2021-10-19 ENCOUNTER — Encounter (HOSPITAL_COMMUNITY): Payer: Self-pay | Admitting: Emergency Medicine

## 2021-10-19 ENCOUNTER — Emergency Department (HOSPITAL_COMMUNITY)
Admission: EM | Admit: 2021-10-19 | Discharge: 2021-10-19 | Disposition: A | Discharge status: Home / Self Care | Payer: Medicaid Other | Attending: Emergency Medicine | Admitting: Emergency Medicine

## 2021-10-19 DIAGNOSIS — J069 Acute upper respiratory infection, unspecified: Secondary | ICD-10-CM | POA: Diagnosis not present

## 2021-10-19 DIAGNOSIS — R059 Cough, unspecified: Secondary | ICD-10-CM | POA: Diagnosis present

## 2021-10-19 DIAGNOSIS — Z20822 Contact with and (suspected) exposure to covid-19: Secondary | ICD-10-CM | POA: Insufficient documentation

## 2021-10-19 LAB — RESP PANEL BY RT-PCR (FLU A&B, COVID) ARPGX2
Influenza A by PCR: NEGATIVE
Influenza B by PCR: NEGATIVE
SARS Coronavirus 2 by RT PCR: NEGATIVE

## 2021-10-19 MED ORDER — BENZONATATE 100 MG PO CAPS: 100.0000 mg | ORAL_CAPSULE | Freq: Three times a day (TID) | ORAL | 0 refills | Status: DC | PRN

## 2021-10-19 NOTE — Discharge Instructions (Signed)
I prescribed you a cough medication called Tessalon Perles.  You can take these up to 3 times per day for your cough.  Please only take them as prescribed. ? ?Please continue to monitor your symptoms closely.  If you develop any new or worsening symptoms please come back to the emergency department. ?

## 2021-10-19 NOTE — ED Provider Notes (Signed)
?[facility] ?Provider Note ? ? ?CSN: [id number] ?Arrival date & time: 10/19/21  1907 ? ?  ? ?History ? ?Chief Complaint  ?Patient presents with  ? Cough  ? Headache  ? ? ?Jeanette Hopkins is a 51 y.o. female. ? ?HPI ?Patient is a 51 year old female who presents to the emergency department due to a cough.  Patient states that last night she had a mild decreased appetite.  Today she then began experiencing a dry cough, rhinorrhea, as well as a mild headache.  Denies any chest pain, shortness of breath, abdominal pain, nausea, vomiting, diarrhea.  ? ?Home Medications ?Prior to Admission medications   ?Medication Sig Start Date End Date Taking? Authorizing Provider  ?benzonatate (TESSALON) 100 MG capsule Take 1 capsule (100 mg total) by mouth 3 (three) times daily as needed for cough. 10/19/21  Yes Rayna Sexton, PA-C  ?cyclobenzaprine (FLEXERIL) 10 MG tablet Take 1 tablet (10 mg total) by mouth 2 (two) times daily as needed for muscle spasms. 06/02/21   Tegeler, Gwenyth Allegra, MD  ?doxycycline (VIBRAMYCIN) 100 MG capsule Take 1 capsule (100 mg total) by mouth 2 (two) times daily. 09/28/21   Carlisle Cater, PA-C  ?gabapentin (NEURONTIN) 400 MG capsule Take 1 capsule (400 mg total) by mouth 3 (three) times daily. 09/26/21   Salley Slaughter, NP  ?ibuprofen (ADVIL,MOTRIN) 200 MG tablet Take 400 mg by mouth every 6 (six) hours as needed for headache or mild pain. ?Patient not taking: Reported on 04/02/2021    [provider]  ?miconazole (MICATIN) 2 % cream Apply 1 application topically 2 (two) times daily. 05/28/21   [name], CNM  ?naproxen (NAPROSYN) 500 MG tablet Take 500 mg by mouth 2 (two) times daily as needed. 05/23/21   [provider]  ?oxyCODONE (OXY IR/ROXICODONE) 5 MG immediate release tablet Take 1 tablet (5 mg total) by mouth every 6 (six) hours as needed for severe pain. 09/28/21   Carlisle Cater, PA-C  ?QUEtiapine (SEROQUEL) 300 MG tablet Take  1 tablet (300 mg total) by mouth at bedtime. 09/26/21   Salley Slaughter, NP  ?traZODone (DESYREL) 100 MG tablet Take 2 tablets (200 mg total) by mouth at bedtime. 09/26/21   Salley Slaughter, NP  ?   ? ?Allergies    ?Patient has no known allergies.   ? ?Review of Systems   ?Review of Systems  ?HENT:  Positive for congestion and rhinorrhea.   ?Respiratory:  Positive for cough. Negative for shortness of breath.   ?Cardiovascular:  Negative for chest pain.  ?Gastrointestinal:  Negative for abdominal pain, diarrhea, nausea and vomiting.  ?Neurological:  Positive for headaches.  ? ?Physical Exam ?Updated Vital Signs ?BP (!) 145/111   Pulse 99   Temp 98.8 ?F (37.1 ?C) (Oral)   Resp 16   Ht '5\' 9"'$  (1.753 m)   Wt 122.5 kg   SpO2 95%   BMI 39.87 kg/m?  ?Physical Exam ?Vitals and nursing note reviewed.  ?Constitutional:   ?   General: She is not in acute distress. ?   Appearance: Normal appearance. She is not ill-appearing, toxic-appearing or diaphoretic.  ?HENT:  ?   Head: Normocephalic and atraumatic.  ?   Right Ear: External ear normal.  ?   Left Ear: External ear normal.  ?   Nose: Nose normal.  ?   Mouth/Throat:  ?   Mouth: Mucous membranes are moist.  ?   Pharynx: Oropharynx is clear. No oropharyngeal  exudate or posterior oropharyngeal erythema.  ?Eyes:  ?   Extraocular Movements: Extraocular movements intact.  ?Cardiovascular:  ?   Rate and Rhythm: Normal rate and regular rhythm.  ?   Pulses: Normal pulses.  ?   Heart sounds: Normal heart sounds. No murmur heard. ?  No friction rub. No gallop.  ?   Comments: RRR without M/R/G. ?Pulmonary:  ?   Effort: Pulmonary effort is normal. No respiratory distress.  ?   Breath sounds: Normal breath sounds. No stridor. No wheezing, rhonchi or rales.  ?   Comments: LCTAB ?Abdominal:  ?   General: Abdomen is flat.  ?   Tenderness: There is no abdominal tenderness.  ?Musculoskeletal:     ?   General: Normal range of motion.  ?   Cervical back: Normal range of motion and neck  supple. No tenderness.  ?Skin: ?   General: Skin is warm and dry.  ?Neurological:  ?   General: No focal deficit present.  ?   Mental Status: She is alert and oriented to person, place, and time.  ?   GCS: GCS eye subscore is 4. GCS verbal subscore is 5. GCS motor subscore is 6.  ?Psychiatric:     ?   Mood and Affect: Mood normal.     ?   Behavior: Behavior normal.  ? ?ED Results / Procedures / Treatments   ?Labs ?(all labs ordered are listed, but only abnormal results are displayed) ?Labs Reviewed  ?RESP PANEL BY RT-PCR (FLU A&B, COVID) [id number]  ? ?EKG ?None ? ?Radiology ?No results found. ? ?Procedures ?Procedures  ? ?Medications Ordered in ED ?Medications - No data to display ? ?ED Course/ Medical Decision Making/ A&P ?  ?                        ?Medical Decision Making ?Risk ?Prescription drug management. ? ?Patient is a nontoxic-appearing 51 year old female who presents to the emergency department due to cough, rhinorrhea, headache. ? ?On my exam heart is regular rate and rhythm without murmurs, rubs, or gallops.  Lungs are clear to auscultation bilaterally.  She denies any chest pain, shortness of breath, or GI symptoms.  Respiratory panel was obtained in triage which is negative. ? ?Discussed the results of her respiratory panel and she seemed quite relieved.  Symptoms appear consistent with a viral URI with cough.  Will discharge patient on a course of Tessalon Perles.  Feel that she is stable for discharge at this time and she is agreeable.  We discussed return precautions.  Her questions were answered and she was amicable at the time of discharge. ?Final Clinical Impression(s) / ED Diagnoses ?Final diagnoses:  ?Viral URI with cough  ? ?Rx / DC Orders ?ED Discharge Orders   ? ?      Ordered  ?  benzonatate (TESSALON) 100 MG capsule  3 times daily PRN       ? 10/19/21 2105  ? ?  ?  ? ?  ? ? ?  ?Rayna Sexton, PA-C ?10/19/21 2131 ? ?  ?Regan Lemming, MD ?10/19/21 2244 ? ?

## 2021-10-19 NOTE — ED Triage Notes (Signed)
Pt reports coughing, running nose and headache.   ?

## 2021-10-20 ENCOUNTER — Telehealth: Payer: Self-pay

## 2021-10-20 NOTE — Telephone Encounter (Signed)
Patient called in to state her prescription was not at the pharmacy, told her it was printed for her, She does not know where papers are, called in  script as written to [facility] on [address] per request. ?

## 2021-10-23 ENCOUNTER — Encounter (HOSPITAL_COMMUNITY): Payer: Self-pay

## 2021-10-28 ENCOUNTER — Other Ambulatory Visit (HOSPITAL_COMMUNITY): Payer: Self-pay | Admitting: Psychiatry

## 2021-10-28 DIAGNOSIS — F3162 Bipolar disorder, current episode mixed, moderate: Secondary | ICD-10-CM

## 2021-12-11 ENCOUNTER — Telehealth (HOSPITAL_COMMUNITY): Payer: Medicaid Other | Admitting: Psychiatry

## 2022-01-26 ENCOUNTER — Other Ambulatory Visit (HOSPITAL_COMMUNITY): Payer: Self-pay | Admitting: Psychiatry

## 2022-01-26 DIAGNOSIS — F3162 Bipolar disorder, current episode mixed, moderate: Secondary | ICD-10-CM

## 2022-01-29 ENCOUNTER — Other Ambulatory Visit (HOSPITAL_COMMUNITY): Payer: Self-pay | Admitting: Psychiatry

## 2022-01-29 DIAGNOSIS — F3162 Bipolar disorder, current episode mixed, moderate: Secondary | ICD-10-CM

## 2022-02-25 ENCOUNTER — Other Ambulatory Visit (HOSPITAL_COMMUNITY): Payer: Self-pay | Admitting: Psychiatry

## 2022-02-25 DIAGNOSIS — F3162 Bipolar disorder, current episode mixed, moderate: Secondary | ICD-10-CM

## 2022-02-28 ENCOUNTER — Emergency Department (HOSPITAL_COMMUNITY)
Admission: EM | Admit: 2022-02-28 | Discharge: 2022-03-01 | Disposition: A | Discharge status: Home / Self Care | Payer: Medicaid Other | Attending: Emergency Medicine | Admitting: Emergency Medicine

## 2022-02-28 ENCOUNTER — Other Ambulatory Visit: Payer: Self-pay

## 2022-02-28 ENCOUNTER — Encounter (HOSPITAL_COMMUNITY): Payer: Self-pay

## 2022-02-28 ENCOUNTER — Emergency Department (HOSPITAL_COMMUNITY): Payer: Medicaid Other

## 2022-02-28 DIAGNOSIS — R0781 Pleurodynia: Secondary | ICD-10-CM | POA: Diagnosis not present

## 2022-02-28 DIAGNOSIS — Y9301 Activity, walking, marching and hiking: Secondary | ICD-10-CM | POA: Insufficient documentation

## 2022-02-28 DIAGNOSIS — S59901A Unspecified injury of right elbow, initial encounter: Secondary | ICD-10-CM | POA: Diagnosis present

## 2022-02-28 DIAGNOSIS — R000 Tachycardia, unspecified: Secondary | ICD-10-CM | POA: Diagnosis not present

## 2022-02-28 DIAGNOSIS — M25572 Pain in left ankle and joints of left foot: Secondary | ICD-10-CM | POA: Insufficient documentation

## 2022-02-28 DIAGNOSIS — Z79899 Other long term (current) drug therapy: Secondary | ICD-10-CM | POA: Insufficient documentation

## 2022-02-28 DIAGNOSIS — M79672 Pain in left foot: Secondary | ICD-10-CM | POA: Diagnosis not present

## 2022-02-28 DIAGNOSIS — S50311A Abrasion of right elbow, initial encounter: Secondary | ICD-10-CM | POA: Insufficient documentation

## 2022-02-28 DIAGNOSIS — R002 Palpitations: Secondary | ICD-10-CM

## 2022-02-28 DIAGNOSIS — W19XXXA Unspecified fall, initial encounter: Secondary | ICD-10-CM | POA: Diagnosis not present

## 2022-02-28 DIAGNOSIS — S93492A Sprain of other ligament of left ankle, initial encounter: Secondary | ICD-10-CM

## 2022-02-28 MED ORDER — SODIUM CHLORIDE 0.9 % IV BOLUS
1000.0000 mL | Freq: Once | INTRAVENOUS | Status: AC
  Administered 2022-03-01: 1000 mL via INTRAVENOUS

## 2022-02-28 MED ORDER — OXYCODONE-ACETAMINOPHEN 5-325 MG PO TABS
1.0000 | ORAL_TABLET | Freq: Once | ORAL | Status: AC
Start: 2022-02-28 — End: 2022-02-28
  Administered 2022-02-28: 1 via ORAL
  Filled 2022-02-28: qty 1

## 2022-02-28 NOTE — ED Provider Notes (Signed)
[facility] DEPT Provider Note   CSN: [id number] Arrival date & time: 02/28/22  2051     History  Chief Complaint  Patient presents with   Fall   Ankle Pain    KATHRENE SINOPOLI is a 51 y.o. female who presents to the ED via EMS complaining of fall onset PTA.  Patient notes that she was helping her family member move some items when she was caught between items which caused her to twist her left ankle.  Denies hitting her head or LOC.  No meds tried prior to arrival.  Has associated left foot pain, abrasion to right elbow, right elbow pain, left rib pain.  Denies fever, color change.  The history is provided by the patient. No language interpreter was used.       Home Medications Prior to Admission medications   Medication Sig Start Date End Date Taking? Authorizing Provider  benzonatate (TESSALON) 100 MG capsule Take 1 capsule (100 mg total) by mouth 3 (three) times daily as needed for cough. 10/19/21   Rayna Sexton, PA-C  cyclobenzaprine (FLEXERIL) 10 MG tablet Take 1 tablet (10 mg total) by mouth 2 (two) times daily as needed for muscle spasms. 06/02/21   Tegeler, Gwenyth Allegra, MD  doxycycline (VIBRAMYCIN) 100 MG capsule Take 1 capsule (100 mg total) by mouth 2 (two) times daily. 09/28/21   Carlisle Cater, PA-C  gabapentin (NEURONTIN) 400 MG capsule Take 1 capsule (400 mg total) by mouth 3 (three) times daily. 09/26/21   Salley Slaughter, NP  ibuprofen (ADVIL,MOTRIN) 200 MG tablet Take 400 mg by mouth every 6 (six) hours as needed for headache or mild pain. Patient not taking: Reported on 04/02/2021    [provider]  miconazole (MICATIN) 2 % cream Apply 1 application topically 2 (two) times daily. 05/28/21   Simpson, Danielle L, CNM  naproxen (NAPROSYN) 500 MG tablet Take 500 mg by mouth 2 (two) times daily as needed. 05/23/21   [provider]  oxyCODONE (OXY IR/ROXICODONE) 5 MG immediate release tablet Take 1 tablet (5 mg total)  by mouth every 6 (six) hours as needed for severe pain. 09/28/21   Carlisle Cater, PA-C  QUEtiapine (SEROQUEL) 300 MG tablet TAKE 1 TABLET(300 MG) BY MOUTH AT BEDTIME 02/25/22   Eulis Canner E, NP  traZODone (DESYREL) 100 MG tablet Take 2 tablets (200 mg total) by mouth at bedtime. 09/26/21   Salley Slaughter, NP      Allergies    Patient has no known allergies.    Review of Systems   Review of Systems  Constitutional:  Negative for fever.  Musculoskeletal:  Positive for arthralgias and joint swelling. Negative for gait problem.  Skin:  Negative for color change, rash and wound.  All other systems reviewed and are negative.   Physical Exam Updated Vital Signs BP (!) 135/121   Pulse (!) 136   Temp 98.2 F (36.8 C) (Oral)   Resp 19   Ht '5\' 9"'$  (1.753 m)   Wt 113.4 kg   SpO2 100%   BMI 36.92 kg/m  Physical Exam Vitals and nursing note reviewed.  Constitutional:      General: She is not in acute distress.    Appearance: Normal appearance.  Eyes:     General: No scleral icterus.    Extraocular Movements: Extraocular movements intact.  Cardiovascular:     Rate and Rhythm: Normal rate.  Pulmonary:     Effort: Pulmonary effort is normal. No respiratory  distress.  Abdominal:     Palpations: Abdomen is soft. There is no mass.     Tenderness: There is no abdominal tenderness.  Musculoskeletal:        General: Normal range of motion.     Cervical back: Neck supple.     Comments: Tenderness to palpation to lateral aspect of left ankle with swelling noted.  No overlying skin changes.  Able to flex and extend left ankle without difficulty.  Pedal pulses intact.  No tenderness to palpation noted to distal left lower leg.  Mild tenderness to palpation noted to right lateral epicondyle.  No overlying swelling or skin changes noted to the area.  Radial pulse intact.  Mild tenderness to palpation noted to left lateral ribs without obvious deformity or crepitus noted.  Skin:    General:  Skin is warm and dry.     Findings: No bruising, erythema or rash.  Neurological:     Mental Status: She is alert.     Sensory: Sensation is intact.     Motor: Motor function is intact.  Psychiatric:        Behavior: Behavior normal.     ED Results / Procedures / Treatments   Labs (all labs ordered are listed, but only abnormal results are displayed) Labs Reviewed - No data to display  EKG None  Radiology No results found.  Procedures Procedures    Medications Ordered in ED Medications  oxyCODONE-acetaminophen (PERCOCET/ROXICET) 5-325 MG per tablet 1 tablet (has no administration in time range)    ED Course/ Medical Decision Making/ A&P                           Medical Decision Making Amount and/or Complexity of Data Reviewed Radiology: ordered.  Risk Prescription drug management.   Pt presents with fall onset PTA. Denies hitting her head. Noted that she twisted her ankle. No meds tried PTA. Vital signs, pt afebrile. On exam, pt with Tenderness to palpation to lateral aspect of left ankle with swelling noted.  No overlying skin changes.  Able to flex and extend left ankle without difficulty.  Pedal pulses intact.  No tenderness to palpation noted to distal left lower leg.  Mild tenderness to palpation noted to right lateral epicondyle.  No overlying swelling or skin changes noted to the area.  Radial pulse intact.  Mild tenderness to palpation noted to left lateral ribs without obvious deformity or crepitus noted. No acute cardiovascular, respiratory, exam findings. Differential diagnosis includes fracture, dislocation, sprain, contusion.   Imaging: I ordered imaging studies including left foot, right elbow, left ankle, left rib x-ray ordered with results pending at sign out.    Medications:  I ordered medication including Percocet, ice for symptom management I have reviewed the patients home medicines and have made adjustments as needed  Patient case discussed  with Silverio Decamp, PA-C at sign-out. Plan at sign-out is pending imaging, likely dispo home with primary care and sports med follow up. Plans may change per oncoming team. Patient care transferred at sign out.    This chart was dictated using voice recognition software, Dragon. Despite the best efforts of this provider to proofread and correct errors, errors may still occur which can change documentation meaning.  Final Clinical Impression(s) / ED Diagnoses Final diagnoses:  Fall, initial encounter    Rx / DC Orders ED Discharge Orders     None         Dierdra Salameh,  Joandry Slagter A, PA-C 02/28/22 2220    Drenda Freeze, MD 02/28/22 [phone]

## 2022-02-28 NOTE — ED Provider Notes (Signed)
Physical Exam  BP (!) 135/121   Pulse 95   Temp 98.2 F (36.8 C) (Oral)   Resp 15   Ht 5\' 9"  (1.753 m)   Wt 113.4 kg   SpO2 100%   BMI 36.92 kg/m   Physical Exam Vitals and nursing note reviewed.  Constitutional:      Appearance: She is obese. She is not ill-appearing or toxic-appearing.  HENT:     Head: Normocephalic and atraumatic.     Mouth/Throat:     Mouth: Mucous membranes are moist.     Pharynx: No oropharyngeal exudate or posterior oropharyngeal erythema.  Eyes:     General:        Right eye: No discharge.        Left eye: No discharge.     Conjunctiva/sclera: Conjunctivae normal.  Cardiovascular:     Rate and Rhythm: Regular rhythm. Tachycardia present.     Pulses: Normal pulses.     Heart sounds: Normal heart sounds. No murmur heard. Pulmonary:     Effort: Pulmonary effort is normal. No respiratory distress.     Breath sounds: Normal breath sounds. No wheezing or rales.  Abdominal:     General: Bowel sounds are normal. There is no distension.     Palpations: Abdomen is soft.     Tenderness: There is no abdominal tenderness.  Musculoskeletal:        General: No deformity.     Cervical back: Neck supple.     Left ankle: Swelling present. No deformity or ecchymosis. Tenderness present. Normal range of motion. Anterior drawer test negative.       Legs:  Skin:    General: Skin is warm and dry.     Capillary Refill: Capillary refill takes less than 2 seconds.  Neurological:     General: No focal deficit present.     Mental Status: She is alert and oriented to person, place, and time. Mental status is at baseline.  Psychiatric:        Mood and Affect: Mood normal.     Procedures  Procedures  ED Course / MDM    Medical Decision Making Amount and/or Complexity of Data Reviewed Labs: ordered. Radiology: ordered.  Risk Prescription drug management.    Care of this patient assumed from preceding ED provider S. Blue, PA-C at time of shift change.   Please see her associated note for further insight into the patient's ED course.  In brief Ms. Jeanette Hopkins had a mechanical fall today presenting with pain in several areas but most severely in her left ankle.  Attempt to change she is awaiting Results of her x-rays.  Oral analgesia administered in triage.  Plain films of the left ankle, foot, right elbow, and chest were visualized by this provider.  Soft tissue swelling of the bimalleolar areas on the left ankle without acute fracture or dislocation, no osseous abnormality in the left foot, right elbow, or left ribs or chest.  No acute cardiopulmonary abnormality. Suspect contusions and soreness from her fall in her ribs and her elbow, ankle sprain on the left.  Compression sleeve offered as well as crutches.  Patient was tachycardic on intake though reportedly quite agitated.  Tachycardia has failure to resolve at this time.  Patient continues to be tachycardic to the 130s at this time though resting calmly in her hospital bed.  When pressing her for more information about her symptoms preceding the onset of her tachycardia she states that her heart has been "fluttering"  for the last couple of weeks.  NO SOB or CP. Feeling overall much more fatigued than is typical for her.  No history of recent travel, prolonged immobilization, surgeries, history of DVT.  Patient did have hysterectomy in 2013 and had lumpectomy for suspected malignancy in the right breast x2.  She is not anticoagulated.  Given concern for persistent tachycardia to the 130s with recent symptomatology do feel laboratory studies and further work-up are warranted.  Patient is agreeable to staying for further laboratory studies and IV rehydration.  She states that she does use cocaine but has not used in nearly 1 week.  No other recreational drug use in the last week.  CBC without leukocytosis or anemia. BMP with hypokalemia of 3, Cr mildy to 1.4 from patient's baseline of 1.3.  Troponin is normal.   UA without evidence of infection but positive for cocaine.  D-dimer mildly elevated to 0.8, though in context of acute left ankle injury.  TSH elevated to 5.3.  Troponin negative, 7.  Patient reevaluated after fluid bolus with complete resolution of her tachycardia.  Heart rate now in the 80s.  Suspect acute dehydration as etiology for tachycardia, possible contribution by cocaine use.  Mildly elevated dimer without tachypnea or hypoxia throughout her stay in the emergency department and with complete resolution of tachycardia after fluid bolus; clinical suspicion for PE is low.  CT angiogram considered, however deferred given resolution of patient's hemodynamic changes and well-appearing patient without any current symptomatology aside from known ankle pain in context of acute left ankle sprain.  We will have patient follow-up with her PCP for hypothyroidism; referral to cardiology also placed given palpitations and context of elevated TSH today.  No further work-up warranted near this time.  Masyn and her mother voiced understanding of her medical evaluation and treatment plan. Each of their questions answered to their expressed satisfaction.  Return precautions were given.  Patient is well-appearing, stable, and was discharged in good condition.  This chart was dictated using voice recognition software, Dragon. Despite the best efforts of this provider to proofread and correct errors, errors may still occur which can change documentation meaning.        Paris Lore, PA-C 03/01/22 0239    Charlynne Pander, MD 03/03/22 [phone]

## 2022-02-28 NOTE — ED Notes (Signed)
Received call from pt's mother stating that her daughter has hx of cardiac problems and is c/o chest pain, but no one is doing anything about it. Pt's mother is very concerned that only the pt's LE injury is being addressed. This writer advised that I will let the EDP know of her concerns.  PA_C Soijett informed and heading to pt's bedside now.

## 2022-02-28 NOTE — ED Triage Notes (Signed)
Pt BIB EMS. Pt was walking and fell this afternoon injuring her left ankle.

## 2022-03-01 LAB — BASIC METABOLIC PANEL
Anion gap: 11 (ref 5–15)
BUN: 14 mg/dL (ref 6–20)
CO2: 23 mmol/L (ref 22–32)
Calcium: 9.6 mg/dL (ref 8.9–10.3)
Chloride: 109 mmol/L (ref 98–111)
Creatinine, Ser: 1.49 mg/dL — ABNORMAL HIGH (ref 0.44–1.00)
GFR, Estimated: 42 mL/min — ABNORMAL LOW (ref >60)
Glucose, Bld: 91 mg/dL (ref 70–99)
Potassium: 3 mmol/L — ABNORMAL LOW (ref 3.5–5.1)
Sodium: 143 mmol/L (ref 135–145)

## 2022-03-01 LAB — CBC WITH DIFFERENTIAL/PLATELET
Abs Immature Granulocytes: 0.01 10*3/uL (ref 0.00–0.07)
Basophils Absolute: 0 10*3/uL (ref 0.0–0.1)
Basophils Relative: 0 %
Eosinophils Absolute: 0.1 10*3/uL (ref 0.0–0.5)
Eosinophils Relative: 3 %
HCT: 43.9 % (ref 36.0–46.0)
Hemoglobin: 15.2 g/dL — ABNORMAL HIGH (ref 12.0–15.0)
Immature Granulocytes: 0 %
Lymphocytes Relative: 33 %
Lymphs Abs: 1.5 10*3/uL (ref 0.7–4.0)
MCH: 31.3 pg (ref 26.0–34.0)
MCHC: 34.6 g/dL (ref 30.0–36.0)
MCV: 90.5 fL (ref 80.0–100.0)
Monocytes Absolute: 0.6 10*3/uL (ref 0.1–1.0)
Monocytes Relative: 13 %
Neutro Abs: 2.4 10*3/uL (ref 1.7–7.7)
Neutrophils Relative %: 51 %
Platelets: 236 10*3/uL (ref 150–400)
RBC: 4.85 MIL/uL (ref 3.87–5.11)
RDW: 13.4 % (ref 11.5–15.5)
WBC: 4.7 10*3/uL (ref 4.0–10.5)
nRBC: 0 % (ref 0.0–0.2)

## 2022-03-01 LAB — URINALYSIS, ROUTINE W REFLEX MICROSCOPIC
Bacteria, UA: NONE SEEN
Bilirubin Urine: NEGATIVE
Glucose, UA: NEGATIVE mg/dL
Ketones, ur: 80 mg/dL — AB
Leukocytes,Ua: NEGATIVE
Nitrite: NEGATIVE
Protein, ur: 30 mg/dL — AB
Specific Gravity, Urine: 1.02 (ref 1.005–1.030)
pH: 6 (ref 5.0–8.0)

## 2022-03-01 LAB — RAPID URINE DRUG SCREEN, HOSP PERFORMED
Amphetamines: NOT DETECTED
Barbiturates: NOT DETECTED
Benzodiazepines: NOT DETECTED
Cocaine: POSITIVE — AB
Opiates: NOT DETECTED
Tetrahydrocannabinol: NOT DETECTED

## 2022-03-01 LAB — TROPONIN I (HIGH SENSITIVITY)
Troponin I (High Sensitivity): 11 ng/L (ref <18)
Troponin I (High Sensitivity): 7 ng/L (ref <18)

## 2022-03-01 LAB — TSH: TSH: 5.374 u[IU]/mL — ABNORMAL HIGH (ref 0.350–4.500)

## 2022-03-01 LAB — D-DIMER, QUANTITATIVE: D-Dimer, Quant: 0.82 ug/mL-FEU — ABNORMAL HIGH (ref 0.00–0.50)

## 2022-03-01 MED ORDER — POTASSIUM CHLORIDE CRYS ER 20 MEQ PO TBCR
40.0000 meq | EXTENDED_RELEASE_TABLET | Freq: Once | ORAL | Status: AC
  Administered 2022-03-01: 40 meq via ORAL
  Filled 2022-03-01: qty 2

## 2022-03-01 MED ORDER — OXYCODONE-ACETAMINOPHEN 5-325 MG PO TABS: 1.0000 | ORAL_TABLET | Freq: Four times a day (QID) | ORAL | 0 refills | Status: DC | PRN

## 2022-03-01 MED ORDER — POTASSIUM CHLORIDE CRYS ER 20 MEQ PO TBCR: 20.0000 meq | EXTENDED_RELEASE_TABLET | Freq: Every day | ORAL | 0 refills | Status: DC

## 2022-03-01 NOTE — ED Notes (Signed)
Labeled urine specimen and culture sent to lab. ENMiles 

## 2022-03-01 NOTE — Discharge Instructions (Signed)
You were seen in the ER today for your ankle pain.  You have an ankle sprain but there are no broken bones from your fall today.  You had blood work drawn given your persistent elevated heart rate.  This revealed that your thyroid is low; for this reason should follow-up with your primary care doctor.  You should also follow-up with the cardiologist listed below for further evaluation of your palpitations.  You been prescribed potassium to take for the next 2 days as your potassium was low in the ER.  Return to the ER with any severe symptoms.

## 2022-03-03 NOTE — Progress Notes (Unsigned)
Cardiology Office Note:    Date:  03/04/2022   ID:  Jeanette Hopkins, DOB [birth year], MRN [id number]  PCP:  Nolene Ebbs, MD   [facility] Providers Cardiologist:  Lenna Sciara, MD Referring MD: Emeline Darling, *   Chief Complaint/Reason for Referral: Palpitations  ASSESSMENT:    1. Palpitations   2. Stage 3 chronic kidney disease, unspecified whether stage 3a or 3b CKD ([place])   3. Body mass index (BMI) of 36.0 to 36.9   4. Cocaine use   5. Precordial pain     PLAN:    In order of problems listed above: 1.  Palpitations: While the patient's tachycardia is likely due to substance abuse with cocaine I will check a monitor and echocardiogram to evaluate further.  I will keep follow-up open-ended depending on these results.  Her TSH was mildly elevated.  We will check a T3 and T4 today.  If abnormal we will instruct the patient to confer with her PCP. 2.  Stage III chronic kidney disease: The patient would benefit from an ACE inhibitor or ARB for renal protection. 3.  Elevated BMI: Consider pharmacotherapy. 4.  Cocaine use: The patient should abstain from cocaine use. 5.  Chest pain: We will obtain a coronary CTA and echocardiogram to evaluate further.  If the patient has mild obstructive coronary artery disease, they will require a statin (with goal LDL < 70) and aspirin, if they have high-grade disease we will need to consider optimal medical therapy and if symptoms are refractory to medical therapy, then a cardiac catheterization with possible PCI will be pursued to alleviate symptoms.  If they have high risk disease we will proceed directly to cardiac catheterization.                Dispo:  Return if symptoms worsen or fail to improve.      Medication Adjustments/Labs and Tests Ordered: Current medicines are reviewed at length with the patient today.  Concerns regarding medicines are outlined above.  The following changes have been made:  no change    Labs/tests ordered: Orders Placed This Encounter  Procedures   CT CORONARY MORPH W/CTA COR W/SCORE W/CA W/CM &/OR WO/CM   T3, free   T4, free   LONG TERM MONITOR (3-14 DAYS)   EKG 12-Lead   ECHOCARDIOGRAM COMPLETE    Medication Changes: Meds ordered this encounter  Medications   metoprolol tartrate (LOPRESSOR) 100 MG tablet    Sig: Take 1 tablet (100 mg total) by mouth once for 1 dose. Take 90-120 minutes prior to scan.    Dispense:  1 tablet    Refill:  0     Current medicines are reviewed at length with the patient today.  The patient does not have concerns regarding medicines.   History of Present Illness:    FOCUSED PROBLEM LIST:   1.  BMI of 36 2.  Stage III chronic kidney disease 3.  Right breast cancer status post lumpectomy 4.  History of cocaine use  The patient is a 51 y.o. female with the indicated medical history here for emergency room follow-up after sustaining a fall.  Imaging demonstrated only soft tissue swelling of her left ankle and no acute fractures.  She was noted to be tachycardic at the time.  Laboratories were notable for an elevated D-dimer and TSH.  Troponins were unremarkable.  Her EKG demonstrated sinus tachycardia versus possible SVT.  Her UDS was positive for cocaine.  She was given IV fluids  and pain medications.  Her heart rate decreased to the 80s.  The patient tells me that she will get chest fluttering every day but not all the time.  This happens occasionally with exertion and sometimes at rest.  It is associated with a little bit of lightheadedness, shortness of breath, and chest discomfort.  She is also diaphoretic at times when doing very little activity.  She unfortunately does continue to use cocaine perhaps every week or so.  She does not work and she does not smoke cigarettes otherwise.  She has not seen her PCP in some time.   Current Medications: Current Meds  Medication Sig   metoprolol tartrate (LOPRESSOR) 100 MG tablet Take  1 tablet (100 mg total) by mouth once for 1 dose. Take 90-120 minutes prior to scan.   oxyCODONE-acetaminophen (PERCOCET/ROXICET) 5-325 MG tablet Take 1 tablet by mouth every 6 (six) hours as needed for severe pain.   QUEtiapine (SEROQUEL) 300 MG tablet TAKE 1 TABLET(300 MG) BY MOUTH AT BEDTIME   traZODone (DESYREL) 100 MG tablet Take 2 tablets (200 mg total) by mouth at bedtime.     Allergies:    Patient has no known allergies.   Social History:   Social History   Tobacco Use   Smoking status: Former    Passive exposure: Never   Smokeless tobacco: Never  Vaping Use   Vaping Use: Never used  Substance Use Topics   Alcohol use: No   Drug use: Not Currently     Family Hx: History reviewed. No pertinent family history.   Review of Systems:   Please see the history of present illness.    All other systems reviewed and are negative.     EKGs/Labs/Other Test Reviewed:    EKG:  EKG performed July 2023 that I personally reviewed demonstrates sinus tachycardia versus SVT.  EKG today that I personally reviewed demonstrates normal sinus rhythm.  Prior CV studies: None available    Other studies Reviewed: Review of the additional studies/records demonstrates: None relevant  Recent Labs: 03/01/2022: BUN 14; Creatinine, Ser 1.49; Hemoglobin 15.2; Platelets 236; Potassium 3.0; Sodium 143; TSH 5.374   Recent Lipid Panel No results found for: "CHOL", "TRIG", "HDL", "LDLCALC", "LDLDIRECT"  Risk Assessment/Calculations:           Physical Exam:    VS:  BP 115/80   Pulse 77   Ht '5\' 9"'$  (1.753 m)   Wt 237 lb 9.6 oz (107.8 kg)   SpO2 95%   BMI 35.09 kg/m    Wt Readings from Last 3 Encounters:  03/04/22 237 lb 9.6 oz (107.8 kg)  02/28/22 250 lb (113.4 kg)  10/19/21 270 lb (122.5 kg)    GENERAL:  No apparent distress, AOx3 HEENT:  No carotid bruits, +2 carotid impulses, no scleral icterus CAR: RRR  no murmurs, gallops, rubs, or thrills RES:  Clear to auscultation  bilaterally ABD:  Soft, nontender, nondistended, positive bowel sounds x 4 VASC:  +2 radial pulses, +2 carotid pulses, palpable pedal pulses NEURO:  CN 2-12 grossly intact; motor and sensory grossly intact PSYCH:  No active depression or anxiety EXT:  No edema, ecchymosis, or cyanosis  Signed, Early Osmond, MD  03/04/2022 9:29 AM    [address], [place], [place]  [address] Phone: [phone]; Fax: [phone]   Note:  This document was prepared using Dragon voice recognition software and may include unintentional dictation errors.

## 2022-03-04 ENCOUNTER — Ambulatory Visit (INDEPENDENT_AMBULATORY_CARE_PROVIDER_SITE_OTHER): Payer: Medicaid Other

## 2022-03-04 ENCOUNTER — Ambulatory Visit (INDEPENDENT_AMBULATORY_CARE_PROVIDER_SITE_OTHER): Payer: Medicaid Other | Admitting: Internal Medicine

## 2022-03-04 ENCOUNTER — Encounter: Payer: Self-pay | Admitting: Internal Medicine

## 2022-03-04 VITALS — BP 115/80 | HR 77 | Ht 69.0 in | Wt 237.6 lb

## 2022-03-04 DIAGNOSIS — R002 Palpitations: Secondary | ICD-10-CM | POA: Diagnosis not present

## 2022-03-04 DIAGNOSIS — N183 Chronic kidney disease, stage 3 unspecified: Secondary | ICD-10-CM | POA: Diagnosis not present

## 2022-03-04 DIAGNOSIS — Z6836 Body mass index (BMI) 36.0-36.9, adult: Secondary | ICD-10-CM | POA: Diagnosis not present

## 2022-03-04 DIAGNOSIS — F1490 Cocaine use, unspecified, uncomplicated: Secondary | ICD-10-CM | POA: Diagnosis not present

## 2022-03-04 DIAGNOSIS — R072 Precordial pain: Secondary | ICD-10-CM

## 2022-03-04 MED ORDER — METOPROLOL TARTRATE 100 MG PO TABS: 100.0000 mg | ORAL_TABLET | Freq: Once | ORAL | 0 refills | Status: DC

## 2022-03-04 NOTE — Progress Notes (Unsigned)
ZIO XT serial # [id number] from office inventory applied to patient.

## 2022-03-04 NOTE — Patient Instructions (Signed)
Medication Instructions:  No changes *If you need a refill on your cardiac medications before your next appointment, please call your pharmacy*   Lab Work: Today: free T4, free T3  If you have labs (blood work) drawn today and your tests are completely normal, you will receive your results only by: [place] (if you have MyChart) OR A paper copy in the mail If you have any lab test that is abnormal or we need to change your treatment, we will call you to review the results.   Testing/Procedures: Your physician has requested that you have an echocardiogram. Echocardiography is a painless test that uses sound waves to create images of your heart. It provides your doctor with information about the size and shape of your heart and how well your heart's chambers and valves are working. This procedure takes approximately one hour. There are no restrictions for this procedure.  Cardiac CTA - see instructions below.  3 Day Zio heart monitor (patch) to be applied today at office   Follow-Up: As needed     Your cardiac CT will be scheduled [facility] [place], [place] [address] [phone]  Please arrive at the [facility] and Children's Entrance (Entrance C2) of [facility] 30 minutes prior to test start time. You can use the FREE valet parking offered at entrance C (encouraged to control the heart rate for the test)  Proceed to the [facility] Radiology Department (first floor) to check-in and test prep.  All radiology patients and guests should use entrance C2 at [facility], accessed from [facility], even though the hospital's physical address listed is [address]     Please follow these instructions carefully (unless otherwise directed):  On the Night Before the Test: Be sure to Drink plenty of water. Do not consume any caffeinated/decaffeinated beverages or chocolate 12 hours prior to your test. Do  not take any antihistamines 12 hours prior to your test.  On the Day of the Test: Drink plenty of water until 1 hour prior to the test. Do not eat any food 4 hours prior to the test. You may take your regular medications prior to the test.  Take metoprolol (Lopressor) two hours prior to test. FEMALES- please wear underwire-free bra if available, avoid dresses & tight clothing       After the Test: Drink plenty of water. After receiving IV contrast, you may experience a mild flushed feeling. This is normal. On occasion, you may experience a mild rash up to 24 hours after the test. This is not dangerous. If this occurs, you can take Benadryl 25 mg and increase your fluid intake. If you experience trouble breathing, this can be serious. If it is severe call 911 IMMEDIATELY. If it is mild, please call our office. If you take any of these medications: Glipizide/Metformin, Avandament, Glucavance, please do not take 48 hours after completing test unless otherwise instructed.  We will call to schedule your test 2-4 weeks out understanding that some insurance companies will need an authorization prior to the service being performed.   For non-scheduling related questions, please contact the cardiac imaging nurse navigator should you have any questions/concerns: Marchia Bond, Cardiac Imaging Nurse Navigator Gordy Clement, Cardiac Imaging Nurse Navigator [facility] Direct Office Dial: [phone]   For scheduling needs, including cancellations and rescheduling, please call [place], [phone].

## 2022-03-05 LAB — T3, FREE: T3, Free: 3.2 pg/mL (ref 2.0–4.4)

## 2022-03-05 LAB — T4, FREE: Free T4: 1.27 ng/dL (ref 0.82–1.77)

## 2022-03-06 ENCOUNTER — Encounter: Payer: Self-pay | Admitting: *Deleted

## 2022-03-12 ENCOUNTER — Ambulatory Visit (HOSPITAL_COMMUNITY): Payer: Medicaid Other | Attending: Internal Medicine

## 2022-03-12 DIAGNOSIS — Z6836 Body mass index (BMI) 36.0-36.9, adult: Secondary | ICD-10-CM | POA: Diagnosis not present

## 2022-03-12 DIAGNOSIS — N183 Chronic kidney disease, stage 3 unspecified: Secondary | ICD-10-CM

## 2022-03-12 DIAGNOSIS — R002 Palpitations: Secondary | ICD-10-CM

## 2022-03-12 DIAGNOSIS — F1490 Cocaine use, unspecified, uncomplicated: Secondary | ICD-10-CM

## 2022-03-12 DIAGNOSIS — R072 Precordial pain: Secondary | ICD-10-CM

## 2022-03-12 LAB — ECHOCARDIOGRAM COMPLETE
Area-P 1/2: 3.99 cm2
S' Lateral: 2.7 cm

## 2022-03-19 ENCOUNTER — Other Ambulatory Visit: Payer: Self-pay | Admitting: Internal Medicine

## 2022-03-20 LAB — COMPLETE METABOLIC PANEL WITH GFR
AG Ratio: 1.7 (calc) (ref 1.0–2.5)
ALT: 8 U/L (ref 6–29)
AST: 14 U/L (ref 10–35)
Albumin: 4.2 g/dL (ref 3.6–5.1)
Alkaline phosphatase (APISO): 46 U/L (ref 37–153)
BUN/Creatinine Ratio: 10 (calc) (ref 6–22)
BUN: 14 mg/dL (ref 7–25)
CO2: 23 mmol/L (ref 20–32)
Calcium: 9.3 mg/dL (ref 8.6–10.4)
Chloride: 107 mmol/L (ref 98–110)
Creat: 1.36 mg/dL — ABNORMAL HIGH (ref 0.50–1.03)
Globulin: 2.5 g/dL (calc) (ref 1.9–3.7)
Glucose, Bld: 71 mg/dL (ref 65–99)
Potassium: 3.9 mmol/L (ref 3.5–5.3)
Sodium: 140 mmol/L (ref 135–146)
Total Bilirubin: 0.5 mg/dL (ref 0.2–1.2)
Total Protein: 6.7 g/dL (ref 6.1–8.1)
eGFR: 47 mL/min/{1.73_m2} — ABNORMAL LOW (ref >=60)

## 2022-03-20 LAB — CBC
HCT: 37.3 % (ref 35.0–45.0)
Hemoglobin: 12.8 g/dL (ref 11.7–15.5)
MCH: 31.5 pg (ref 27.0–33.0)
MCHC: 34.3 g/dL (ref 32.0–36.0)
MCV: 91.9 fL (ref 80.0–100.0)
MPV: 10.7 fL (ref 7.5–12.5)
Platelets: 205 10*3/uL (ref 140–400)
RBC: 4.06 10*6/uL (ref 3.80–5.10)
RDW: 12.9 % (ref 11.0–15.0)
WBC: 3.2 10*3/uL — ABNORMAL LOW (ref 3.8–10.8)

## 2022-03-20 LAB — LIPID PANEL
Cholesterol: 178 mg/dL (ref <200)
HDL: 47 mg/dL — ABNORMAL LOW (ref >=50)
LDL Cholesterol (Calc): 118 mg/dL (calc) — ABNORMAL HIGH
Non-HDL Cholesterol (Calc): 131 mg/dL (calc) — ABNORMAL HIGH (ref <130)
Total CHOL/HDL Ratio: 3.8 (calc) (ref <5.0)
Triglycerides: 43 mg/dL (ref <150)

## 2022-03-20 LAB — TSH: TSH: 2.28 mIU/L

## 2022-03-20 LAB — T4
Free Thyroxine Index: 2 (ref 1.4–3.8)
T4, Total: 6.6 ug/dL (ref 5.1–11.9)

## 2022-03-20 LAB — T3 UPTAKE: T3 Uptake: 31 % (ref 22–35)

## 2022-03-20 LAB — VITAMIN D 25 HYDROXY (VIT D DEFICIENCY, FRACTURES): Vit D, 25-Hydroxy: 28 ng/mL — ABNORMAL LOW (ref 30–100)

## 2022-03-20 LAB — T3: T3, Total: 97 ng/dL (ref 76–181)

## 2022-03-21 ENCOUNTER — Telehealth (HOSPITAL_COMMUNITY): Payer: Self-pay | Admitting: *Deleted

## 2022-03-21 NOTE — Telephone Encounter (Signed)
Attempted to call patient regarding upcoming cardiac CT appointment. °Left message on voicemail with name and callback number ° °Billal Rollo RN Navigator Cardiac Imaging °[facility] °[phone] Office °[phone] Cell ° °

## 2022-03-21 NOTE — Telephone Encounter (Signed)
Patient returning call regarding upcoming cardiac imaging study; pt verbalizes understanding of appt date/time, parking situation and where to check in, pre-test NPO status and medications ordered, and verified current allergies; name and call back number provided for further questions should they arise  Gordy Clement RN Navigator Cardiac Imaging [facility] [phone] office [phone] cell  Patient to take '100mg'$  metoprolol tartrate two hours prior to her cardiac CT scan.  She is aware to arrive at 12:30pm.

## 2022-03-23 ENCOUNTER — Telehealth (HOSPITAL_COMMUNITY): Payer: Self-pay | Admitting: Emergency Medicine

## 2022-03-23 NOTE — Telephone Encounter (Signed)
Pt informed that we had to cancel her CCTA appt tomorrow 8/7 due to reading software glitch.  New appt made for 8/24. Pt appreciated the call Marchia Bond RN Navigator Cardiac Imaging [facility] [phone] Office  [phone] Cell

## 2022-03-24 ENCOUNTER — Ambulatory Visit (HOSPITAL_COMMUNITY): Admission: RE | Admit: 2022-03-24 | Payer: Medicaid Other | Source: Ambulatory Visit

## 2022-03-27 ENCOUNTER — Other Ambulatory Visit (HOSPITAL_COMMUNITY): Payer: Self-pay | Admitting: Psychiatry

## 2022-03-27 DIAGNOSIS — F3162 Bipolar disorder, current episode mixed, moderate: Secondary | ICD-10-CM

## 2022-04-09 ENCOUNTER — Telehealth (HOSPITAL_COMMUNITY): Payer: Self-pay | Admitting: Emergency Medicine

## 2022-04-09 NOTE — Telephone Encounter (Signed)
Reaching out to patient to offer assistance regarding upcoming cardiac imaging study; pt verbalizes understanding of appt date/time, parking situation and where to check in, pre-test NPO status and medications ordered, and verified current allergies; name and call back number provided for further questions should they arise Marchia Bond RN Navigator Cardiac Imaging [facility] [phone] office [phone] cell  Arrival 200 w/c entrnace '100mg'$  metoprolol tartrte Denies iv issues

## 2022-04-10 ENCOUNTER — Ambulatory Visit (HOSPITAL_COMMUNITY): Admission: RE | Admit: 2022-04-10 | Payer: Medicaid Other | Source: Ambulatory Visit

## 2022-04-22 ENCOUNTER — Other Ambulatory Visit (HOSPITAL_COMMUNITY): Payer: Self-pay | Admitting: Emergency Medicine

## 2022-04-22 ENCOUNTER — Emergency Department (HOSPITAL_COMMUNITY): Payer: Medicaid Other

## 2022-04-22 ENCOUNTER — Encounter (HOSPITAL_COMMUNITY): Payer: Self-pay | Admitting: Emergency Medicine

## 2022-04-22 ENCOUNTER — Telehealth (HOSPITAL_COMMUNITY): Payer: Self-pay | Admitting: Emergency Medicine

## 2022-04-22 ENCOUNTER — Emergency Department (HOSPITAL_COMMUNITY)
Admission: EM | Admit: 2022-04-22 | Discharge: 2022-04-22 | Disposition: A | Discharge status: Home / Self Care | Payer: Medicaid Other | Attending: Student | Admitting: Student

## 2022-04-22 DIAGNOSIS — R072 Precordial pain: Secondary | ICD-10-CM

## 2022-04-22 DIAGNOSIS — R1013 Epigastric pain: Secondary | ICD-10-CM | POA: Diagnosis not present

## 2022-04-22 DIAGNOSIS — Z5321 Procedure and treatment not carried out due to patient leaving prior to being seen by health care provider: Secondary | ICD-10-CM | POA: Insufficient documentation

## 2022-04-22 HISTORY — DX: Essential (primary) hypertension: I10

## 2022-04-22 LAB — COMPREHENSIVE METABOLIC PANEL
ALT: 12 U/L (ref 0–44)
AST: 16 U/L (ref 15–41)
Albumin: 3.7 g/dL (ref 3.5–5.0)
Alkaline Phosphatase: 41 U/L (ref 38–126)
Anion gap: 9 (ref 5–15)
BUN: 21 mg/dL — ABNORMAL HIGH (ref 6–20)
CO2: 24 mmol/L (ref 22–32)
Calcium: 9.4 mg/dL (ref 8.9–10.3)
Chloride: 108 mmol/L (ref 98–111)
Creatinine, Ser: 1.23 mg/dL — ABNORMAL HIGH (ref 0.44–1.00)
GFR, Estimated: 53 mL/min — ABNORMAL LOW (ref >60)
Glucose, Bld: 82 mg/dL (ref 70–99)
Potassium: 3.3 mmol/L — ABNORMAL LOW (ref 3.5–5.1)
Sodium: 141 mmol/L (ref 135–145)
Total Bilirubin: 0.9 mg/dL (ref 0.3–1.2)
Total Protein: 6.5 g/dL (ref 6.5–8.1)

## 2022-04-22 LAB — CBC WITH DIFFERENTIAL/PLATELET
Abs Immature Granulocytes: 0.01 10*3/uL (ref 0.00–0.07)
Basophils Absolute: 0 10*3/uL (ref 0.0–0.1)
Basophils Relative: 1 %
Eosinophils Absolute: 0.2 10*3/uL (ref 0.0–0.5)
Eosinophils Relative: 3 %
HCT: 39.4 % (ref 36.0–46.0)
Hemoglobin: 14 g/dL (ref 12.0–15.0)
Immature Granulocytes: 0 %
Lymphocytes Relative: 31 %
Lymphs Abs: 1.4 10*3/uL (ref 0.7–4.0)
MCH: 31.7 pg (ref 26.0–34.0)
MCHC: 35.5 g/dL (ref 30.0–36.0)
MCV: 89.1 fL (ref 80.0–100.0)
Monocytes Absolute: 0.5 10*3/uL (ref 0.1–1.0)
Monocytes Relative: 11 %
Neutro Abs: 2.5 10*3/uL (ref 1.7–7.7)
Neutrophils Relative %: 54 %
Platelets: 204 10*3/uL (ref 150–400)
RBC: 4.42 MIL/uL (ref 3.87–5.11)
RDW: 12.9 % (ref 11.5–15.5)
WBC: 4.6 10*3/uL (ref 4.0–10.5)
nRBC: 0 % (ref 0.0–0.2)

## 2022-04-22 LAB — TROPONIN I (HIGH SENSITIVITY): Troponin I (High Sensitivity): <2 ng/L (ref <18)

## 2022-04-22 LAB — LIPASE, BLOOD: Lipase: 24 U/L (ref 11–51)

## 2022-04-22 MED ORDER — METOPROLOL TARTRATE 100 MG PO TABS: 100.0000 mg | ORAL_TABLET | Freq: Once | ORAL | 0 refills | Status: DC

## 2022-04-22 NOTE — Telephone Encounter (Signed)
Represcribed metoprolol tartrate to pharm on file  [facility] [phone] Office  [phone] Cell

## 2022-04-22 NOTE — ED Provider Triage Note (Signed)
Emergency Medicine Provider Triage Evaluation Note  Jeanette Hopkins , a 51 y.o. female  was evaluated in triage.  Pt complains of chest pain after eating a bologna sandwich today.  Patient states the pain was sharp in the center of her chest in her epigastric region.  Rates the pain a 2/10 at this time.  Denies associated nausea, vomiting, diaphoresis.  She reports some shortness of breath with the pain.  She does report cardiac history..  Review of Systems  Positive: Chest pain, nausea, shortness of breath Negative: Vomiting, diarrhea, urinary symptoms, lightheadedness, dizziness  Physical Exam  BP 102/68   Pulse 68   Temp 98.2 F (36.8 C) (Oral)   Resp 17   Ht '5\' 10"'$  (1.778 m)   Wt 104.3 kg   SpO2 100%   BMI 33.00 kg/m  Gen:   Awake, no distress   Resp:  Normal effort  MSK:   Moves extremities without difficulty  Other:  Heart regular rate and rhythm.  Lungs clear to auscultation bilaterally.  No focal abdominal tenderness.  Medical Decision Making  Medically screening exam initiated at 2:55 PM.  Appropriate orders placed.  KEORA ECCLESTON was informed that the remainder of the evaluation will be completed by another provider, this initial triage assessment does not replace that evaluation, and the importance of remaining in the ED until their evaluation is complete.  Patient presents for chest pain and abdominal pain after eating a bologna sandwich today.  Differential diagnosis includes ACS, pancreatitis, gastritis, GERD, peptic ulcer disease.  Patient does have cardiac history.  Will obtain basic lab work and imaging.  EKG was reviewed that shows sinus rhythm without any acute ischemic changes noted.   Doristine Devoid, PA-C 04/22/22 1456

## 2022-04-22 NOTE — ED Notes (Signed)
No longer wanted to wait  

## 2022-04-22 NOTE — ED Triage Notes (Signed)
Pt arrives via EMS from home with epigastric pain after drinking ice water and having a sandwich. Pt recently wore Holter monitor. EKG unremarkable for EMS. Pt states she has been talking to cardiology and plan for CT today.   112/60 20G LAC

## 2022-04-29 ENCOUNTER — Telehealth (HOSPITAL_COMMUNITY): Payer: Self-pay | Admitting: Emergency Medicine

## 2022-04-29 DIAGNOSIS — R072 Precordial pain: Secondary | ICD-10-CM

## 2022-04-29 MED ORDER — METOPROLOL TARTRATE 100 MG PO TABS: 100.0000 mg | ORAL_TABLET | Freq: Once | ORAL | 0 refills | Status: DC

## 2022-04-29 NOTE — Telephone Encounter (Signed)
Reaching out to patient to offer assistance regarding upcoming cardiac imaging study; pt verbalizes understanding of appt date/time, parking situation and where to check in, pre-test NPO status and medications ordered, and verified current allergies; name and call back number provided for further questions should they arise Marchia Bond RN Navigator Cardiac Imaging [facility] [phone] office [phone] cell  Arrival 1130 w/c entrance  Pt took metoprolol today because someone told her to take today. I pointed out that the bottle rx stated to take 90-120 mins prior to scan. She said "oh I see that now". I resent rx to pharm on file, pt verbalized to pick it up today for tomorrow at 10a (2 hr prior to scan).

## 2022-04-30 ENCOUNTER — Ambulatory Visit (HOSPITAL_COMMUNITY)
Admission: RE | Admit: 2022-04-30 | Discharge: 2022-04-30 | Disposition: A | Discharge status: Home / Self Care | Payer: Medicaid Other | Source: Ambulatory Visit | Attending: Internal Medicine | Admitting: Internal Medicine

## 2022-04-30 DIAGNOSIS — R072 Precordial pain: Secondary | ICD-10-CM | POA: Insufficient documentation

## 2022-04-30 MED ORDER — NITROGLYCERIN 0.4 MG SL SUBL
SUBLINGUAL_TABLET | SUBLINGUAL | Status: AC
  Administered 2022-04-30: 0.8 mg via SUBLINGUAL
  Filled 2022-04-30: qty 2

## 2022-04-30 MED ORDER — NITROGLYCERIN 0.4 MG SL SUBL: 0.8000 mg | SUBLINGUAL_TABLET | Freq: Once | SUBLINGUAL | Status: DC

## 2022-04-30 MED ORDER — NITROGLYCERIN 0.4 MG SL SUBL: 0.8000 mg | SUBLINGUAL_TABLET | Freq: Once | SUBLINGUAL | Status: AC

## 2022-04-30 MED ORDER — IOHEXOL 350 MG/ML SOLN
100.0000 mL | Freq: Once | INTRAVENOUS | Status: AC | PRN
  Administered 2022-04-30: 100 mL via INTRAVENOUS

## 2022-05-01 ENCOUNTER — Telehealth: Payer: Self-pay | Admitting: Internal Medicine

## 2022-05-01 NOTE — Telephone Encounter (Signed)
Pt called and shared Dr. Ali Lowe note:  Let patient know that her coronary CTA demonstrated absolutely no blockages in the heart arteries.  She should follow-up with her PCP regarding noncardiac chest pain.  Pt educated on GERD, what it is, and something to discuss with her PCP.  Pt understood the coronary CTA looked good with no blockages.  Pt understood all education, no f/u required at this time.

## 2022-05-01 NOTE — Telephone Encounter (Signed)
Follow Up:    Patient is returning call, concerning her CT results.u

## 2022-05-31 ENCOUNTER — Other Ambulatory Visit (HOSPITAL_COMMUNITY): Payer: Self-pay | Admitting: Psychiatry

## 2022-05-31 DIAGNOSIS — F3162 Bipolar disorder, current episode mixed, moderate: Secondary | ICD-10-CM

## 2022-06-17 ENCOUNTER — Encounter (HOSPITAL_COMMUNITY): Payer: Self-pay

## 2022-06-17 ENCOUNTER — Ambulatory Visit (HOSPITAL_COMMUNITY)
Admission: EM | Admit: 2022-06-17 | Discharge: 2022-06-17 | Disposition: A | Discharge status: Home / Self Care | Payer: Medicaid Other | Attending: Family Medicine | Admitting: Family Medicine

## 2022-06-17 ENCOUNTER — Ambulatory Visit (INDEPENDENT_AMBULATORY_CARE_PROVIDER_SITE_OTHER): Payer: Medicaid Other

## 2022-06-17 DIAGNOSIS — M79644 Pain in right finger(s): Secondary | ICD-10-CM

## 2022-06-17 DIAGNOSIS — M25531 Pain in right wrist: Secondary | ICD-10-CM

## 2022-06-17 DIAGNOSIS — M549 Dorsalgia, unspecified: Secondary | ICD-10-CM | POA: Diagnosis not present

## 2022-06-17 DIAGNOSIS — M25572 Pain in left ankle and joints of left foot: Secondary | ICD-10-CM

## 2022-06-17 DIAGNOSIS — M25571 Pain in right ankle and joints of right foot: Secondary | ICD-10-CM | POA: Diagnosis not present

## 2022-06-17 DIAGNOSIS — M79641 Pain in right hand: Secondary | ICD-10-CM | POA: Diagnosis not present

## 2022-06-17 MED ORDER — OXYCODONE-ACETAMINOPHEN 5-325 MG PO TABS: 1.0000 | ORAL_TABLET | Freq: Four times a day (QID) | ORAL | 0 refills | Status: AC | PRN

## 2022-06-17 MED ORDER — HYDROCODONE-ACETAMINOPHEN 5-325 MG PO TABS
ORAL_TABLET | ORAL | Status: AC
  Filled 2022-06-17: qty 1

## 2022-06-17 MED ORDER — HYDROCODONE-ACETAMINOPHEN 5-325 MG PO TABS
1.0000 | ORAL_TABLET | Freq: Once | ORAL | Status: AC
  Administered 2022-06-17: 1 via ORAL
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
## 2022-06-26 ENCOUNTER — Ambulatory Visit (INDEPENDENT_AMBULATORY_CARE_PROVIDER_SITE_OTHER): Payer: Medicaid Other

## 2022-06-26 ENCOUNTER — Ambulatory Visit (HOSPITAL_COMMUNITY)
Admission: EM | Admit: 2022-06-26 | Discharge: 2022-06-26 | Disposition: A | Discharge status: Home / Self Care | Payer: Medicaid Other | Attending: Emergency Medicine | Admitting: Emergency Medicine

## 2022-06-26 DIAGNOSIS — M25512 Pain in left shoulder: Secondary | ICD-10-CM | POA: Diagnosis not present

## 2022-06-26 DIAGNOSIS — S46912A Strain of unspecified muscle, fascia and tendon at shoulder and upper arm level, left arm, initial encounter: Secondary | ICD-10-CM

## 2022-06-26 MED ORDER — PREDNISONE 20 MG PO TABS: 40.0000 mg | ORAL_TABLET | Freq: Every day | ORAL | 0 refills | Status: DC

## 2022-06-26 MED ORDER — CYCLOBENZAPRINE HCL 10 MG PO TABS: 10.0000 mg | ORAL_TABLET | Freq: Two times a day (BID) | ORAL | 0 refills | Status: DC | PRN
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
## 2022-06-26 NOTE — Discharge Instructions (Addendum)
Prednisone has been sent to the pharmacy, you will take 2 tablets in the morning for the next 5 days.   Flexeril has been sent to the pharmacy, take this 2 times daily.  Be mindful that this medication can make you sleepy, do not operate any heavy machinery or drive a car after taking this medication.   Please schedule an appointment with the orthopedist tomorrow.
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
## 2022-07-15 ENCOUNTER — Encounter (HOSPITAL_COMMUNITY): Payer: Self-pay | Admitting: Emergency Medicine

## 2022-07-15 ENCOUNTER — Ambulatory Visit (HOSPITAL_COMMUNITY)
Admission: EM | Admit: 2022-07-15 | Discharge: 2022-07-15 | Disposition: A | Discharge status: Home / Self Care | Payer: Medicaid Other | Attending: Emergency Medicine | Admitting: Emergency Medicine

## 2022-07-15 DIAGNOSIS — N764 Abscess of vulva: Secondary | ICD-10-CM | POA: Diagnosis present

## 2022-07-15 MED ORDER — SULFAMETHOXAZOLE-TRIMETHOPRIM 800-160 MG PO TABS: 1.0000 | ORAL_TABLET | Freq: Two times a day (BID) | ORAL | 0 refills | Status: DC

## 2022-07-15 MED ORDER — TRAMADOL HCL 50 MG PO TABS: 50.0000 mg | ORAL_TABLET | Freq: Two times a day (BID) | ORAL | 0 refills | Status: AC | PRN
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
## 2022-07-15 NOTE — Discharge Instructions (Addendum)
Take the antibiotic as prescribed. This will treat the infection on your labia. Please take with food to avoid upset stomach.  You can take up to 800 mg ibuprofen every 6 hours for pain. For severe pain, you can take the tramadol which is a narcotic medication. Please do not drink alcohol or use other sedating medications with the tramadol. Do not drive if you choose to take it.  Please follow up with your new ob/gyn regarding this abscess.

## 2022-07-15 NOTE — ED Triage Notes (Signed)
Pt c/o boil in right side of vaginal area that is getting larger and more painful

## 2022-07-16 ENCOUNTER — Telehealth (HOSPITAL_COMMUNITY): Payer: Self-pay | Admitting: Emergency Medicine

## 2022-07-16 LAB — CERVICOVAGINAL ANCILLARY ONLY
Bacterial Vaginitis (gardnerella): POSITIVE — AB
Candida Glabrata: POSITIVE — AB
Candida Vaginitis: POSITIVE — AB
Chlamydia: NEGATIVE
Comment: NEGATIVE
Comment: NEGATIVE
Comment: NEGATIVE
Comment: NEGATIVE
Comment: NEGATIVE
Comment: NORMAL
Neisseria Gonorrhea: NEGATIVE
Trichomonas: NEGATIVE

## 2022-07-16 MED ORDER — METRONIDAZOLE 500 MG PO TABS: 500.0000 mg | ORAL_TABLET | Freq: Two times a day (BID) | ORAL | 0 refills | Status: DC

## 2022-07-16 MED ORDER — FLUCONAZOLE 150 MG PO TABS: 150.0000 mg | ORAL_TABLET | Freq: Once | ORAL | 0 refills | Status: AC

## 2022-07-18 ENCOUNTER — Encounter: Payer: Medicaid Other | Admitting: Radiology

## 2022-07-20 ENCOUNTER — Other Ambulatory Visit: Payer: Self-pay

## 2022-07-20 ENCOUNTER — Emergency Department (HOSPITAL_COMMUNITY)
Admission: EM | Admit: 2022-07-20 | Discharge: 2022-07-20 | Disposition: A | Discharge status: Home / Self Care | Payer: Medicaid Other | Attending: Emergency Medicine | Admitting: Emergency Medicine

## 2022-07-20 ENCOUNTER — Encounter (HOSPITAL_COMMUNITY): Payer: Self-pay

## 2022-07-20 DIAGNOSIS — N751 Abscess of Bartholin's gland: Secondary | ICD-10-CM | POA: Insufficient documentation

## 2022-07-20 DIAGNOSIS — N9489 Other specified conditions associated with female genital organs and menstrual cycle: Secondary | ICD-10-CM

## 2022-07-20 MED ORDER — OXYCODONE HCL 5 MG PO TABS: 5.0000 mg | ORAL_TABLET | Freq: Four times a day (QID) | ORAL | 0 refills | Status: DC | PRN

## 2022-07-20 MED ORDER — DOXYCYCLINE HYCLATE 100 MG PO CAPS: 100.0000 mg | ORAL_CAPSULE | Freq: Two times a day (BID) | ORAL | 0 refills | Status: AC

## 2022-07-20 MED ORDER — HYDROMORPHONE HCL 2 MG/ML IJ SOLN
2.0000 mg | Freq: Once | INTRAMUSCULAR | Status: AC
  Administered 2022-07-20: 2 mg via INTRAMUSCULAR
  Filled 2022-07-20: qty 1

## 2022-07-20 MED ORDER — FLUCONAZOLE 200 MG PO TABS: 200.0000 mg | ORAL_TABLET | Freq: Every day | ORAL | 0 refills | Status: AC

## 2022-07-20 MED ORDER — LIDOCAINE-EPINEPHRINE-TETRACAINE (LET) TOPICAL GEL
3.0000 mL | Freq: Once | TOPICAL | Status: AC
  Administered 2022-07-20: 3 mL via TOPICAL
  Filled 2022-07-20: qty 3

## 2022-07-20 NOTE — Discharge Instructions (Signed)
You should stop taking the Bactrim antibiotic and instead will start taking doxycycline.  You can continue with the Flagyl antibiotic which is treatment for bacterial vaginosis.  If you develop signs or symptoms of yeast infection I prescribed a new medicine called fluconazole which she can take as a single dose to treat for yeast infection.  Please follow-up with a gynecologist as scheduled this week.

## 2022-07-20 NOTE — ED Triage Notes (Signed)
PTAR reports pt coming from home c/o vaginal boil, was seen at [facility] and given antibiotics. Pt in severe pain at site.

## 2022-07-20 NOTE — ED Provider Notes (Signed)
[facility] DEPT Provider Note   CSN: [id number] Arrival date & time: 07/20/22  1811     History  Chief Complaint  Patient presents with   vaginal abscess    Jeanette Hopkins is a 51 y.o. female presented to the with pain and swelling near the right lower labia. Per medical records patient was seen at an urgent care 5 days ago on 07/15/22, diagnosed with a potential labial abscess, was felt to be too small for drainage at the time, and she was started on Bactrim, and subsequently on Flagyl as well as she was told she was diagnosed with BV.  She was given tramadol for pain.  She returns today complaining of worsening pain and swelling at the site.  It is not draining.  She reports a history of recurring abscesses in different parts of the body.  Review of external records but she was at urgent care she had a vaginal swab which is negative for gonorrhea and chlamydia and trichomonas positive for BV and Candida.  The patient reports she was given an antifungal medicine as well.  HPI     Home Medications Prior to Admission medications   Medication Sig Start Date End Date Taking? Authorizing Provider  doxycycline (VIBRAMYCIN) 100 MG capsule Take 1 capsule (100 mg total) by mouth 2 (two) times daily for 7 days. 07/20/22 07/27/22 Yes Mahayla Haddaway, Carola Rhine, MD  fluconazole (DIFLUCAN) 200 MG tablet Take 1 tablet (200 mg total) by mouth daily for 1 dose. 07/20/22 07/21/22 Yes Brittania Sudbeck, Carola Rhine, MD  oxyCODONE (ROXICODONE) 5 MG immediate release tablet Take 1 tablet (5 mg total) by mouth every 6 (six) hours as needed for up to 8 doses for severe pain. 07/20/22  Yes Kevona Lupinacci, Carola Rhine, MD  cyclobenzaprine (FLEXERIL) 10 MG tablet Take 1 tablet (10 mg total) by mouth 2 (two) times daily as needed for muscle spasms. 06/26/22   Flossie Dibble, NP  gabapentin (NEURONTIN) 400 MG capsule Take 1 capsule (400 mg total) by mouth 3 (three) times daily. 09/26/21   Salley Slaughter, NP   metoprolol tartrate (LOPRESSOR) 100 MG tablet Take 1 tablet (100 mg total) by mouth once for 1 dose. Take 90-120 minutes prior to scan. 04/29/22 04/29/22  Early Osmond, MD  metroNIDAZOLE (FLAGYL) 500 MG tablet Take 1 tablet (500 mg total) by mouth 2 (two) times daily. 07/16/22   Lamptey, Myrene Galas, MD  predniSONE (DELTASONE) 20 MG tablet Take 2 tablets (40 mg total) by mouth daily. 06/26/22   Flossie Dibble, NP  QUEtiapine (SEROQUEL) 300 MG tablet TAKE 1 TABLET(300 MG) BY MOUTH AT BEDTIME 06/02/22   Salley Slaughter, NP      Allergies    Patient has no known allergies.    Review of Systems   Review of Systems  Physical Exam Updated Vital Signs BP 119/85   Pulse 69   Temp 98.1 F (36.7 C) (Oral)   Resp 17   Ht '5\' 10"'$  (1.778 m)   Wt 104.3 kg   SpO2 99%   BMI 33.00 kg/m  Physical Exam Constitutional:      General: She is not in acute distress. HENT:     Head: Normocephalic and atraumatic.  Eyes:     Conjunctiva/sclera: Conjunctivae normal.     Pupils: Pupils are equal, round, and reactive to light.  Cardiovascular:     Rate and Rhythm: Normal rate and regular rhythm.  Pulmonary:     Effort: Pulmonary effort  is normal. No respiratory distress.  Abdominal:     General: There is no distension.     Tenderness: There is no abdominal tenderness.  Genitourinary:    Comments: GU exam was performed with Terri Piedra patient's RN present Right lower labia with tenderness and swelling and fluctuance  Skin:    General: Skin is warm and dry.  Neurological:     General: No focal deficit present.     Mental Status: She is alert. Mental status is at baseline.  Psychiatric:        Mood and Affect: Mood normal.        Behavior: Behavior normal.     ED Results / Procedures / Treatments   Labs (all labs ordered are listed, but only abnormal results are displayed) Labs Reviewed - No data to display  EKG None  Radiology No results found.  Procedures .Marland KitchenIncision and  Drainage  Date/Time: 07/20/2022 8:04 PM  Performed by: Wyvonnia Dusky, MD Authorized by: Wyvonnia Dusky, MD   Consent:    Consent obtained:  Verbal   Consent given by:  Patient   Risks discussed:  Incomplete drainage, infection, pain and bleeding   Alternatives discussed:  Alternative treatment and delayed treatment Universal protocol:    Procedure explained and questions answered to patient or proxy's satisfaction: yes     Relevant documents present and verified: yes     Test results available : yes     Imaging studies available: yes     Required blood products, implants, devices, and special equipment available: yes     Site/side marked: yes     Immediately prior to procedure, a time out was called: yes     Patient identity confirmed:  Arm band Location:    Type:  Abscess   Location:  Anogenital   Anogenital location:  Bartholin's gland Pre-procedure details:    Skin preparation:  Povidone-iodine Sedation:    Sedation type:  Anxiolysis Anesthesia:    Anesthesia method:  Topical application   Topical anesthetic:  LET Procedure type:    Complexity:  Simple Procedure details:    Incision types:  Stab incision   Drainage:  Bloody   Drainage amount:  Scant   Wound treatment:  Wound left open Post-procedure details:    Procedure completion:  Tolerated well, no immediate complications     Medications Ordered in ED Medications  HYDROmorphone (DILAUDID) injection 2 mg (2 mg Intramuscular Given 07/20/22 1857)  lidocaine-EPINEPHrine-tetracaine (LET) topical gel (3 mLs Topical Given 07/20/22 1858)    ED Course/ Medical Decision Making/ A&P                           Medical Decision Making Risk Prescription drug management.   Patient did present with complaint of right lower labial swelling and pain, on bedside ultrasound I saw a dense heterogenous collection I thought could be consistent with abscess just beneath the skin on the right lower labia, near the Bartholin  gland.  An incision was made and returned predominantly scant to moderate bloody return, no evident pus.  I do not see any other drainable collection at the bedside in the ED.  Patient was given IM pain control which helped, and I will switch her antibiotics from Bactrim to doxycycline.  She will continue with Flagyl which is prescribed for BV.  Fluconazole was also offered for home use.  Patient reports that she has a gynecology appointment scheduled on  Tuesday which would be good follow-up.        Final Clinical Impression(s) / ED Diagnoses Final diagnoses:  Labial swelling    Rx / DC Orders ED Discharge Orders          Ordered    doxycycline (VIBRAMYCIN) 100 MG capsule  2 times daily        07/20/22 2001    fluconazole (DIFLUCAN) 200 MG tablet  Daily        07/20/22 2001    oxyCODONE (ROXICODONE) 5 MG immediate release tablet  Every 6 hours PRN        07/20/22 2003              Wyvonnia Dusky, MD 07/20/22 2006

## 2022-07-22 ENCOUNTER — Encounter: Payer: Medicaid Other | Admitting: Radiology

## 2022-07-25 ENCOUNTER — Other Ambulatory Visit (HOSPITAL_COMMUNITY): Payer: Self-pay | Admitting: Psychiatry

## 2022-07-25 DIAGNOSIS — F3162 Bipolar disorder, current episode mixed, moderate: Secondary | ICD-10-CM

## 2022-08-05 ENCOUNTER — Other Ambulatory Visit (HOSPITAL_COMMUNITY): Payer: Self-pay | Admitting: Psychiatry

## 2022-08-05 DIAGNOSIS — F3162 Bipolar disorder, current episode mixed, moderate: Secondary | ICD-10-CM

## 2022-09-01 ENCOUNTER — Other Ambulatory Visit (HOSPITAL_COMMUNITY): Payer: Self-pay | Admitting: Psychiatry

## 2022-09-01 ENCOUNTER — Emergency Department (HOSPITAL_COMMUNITY)
Admission: EM | Admit: 2022-09-01 | Discharge: 2022-09-03 | Disposition: A | Discharge status: Home / Self Care | Payer: Medicaid Other | Attending: Emergency Medicine | Admitting: Emergency Medicine

## 2022-09-01 ENCOUNTER — Other Ambulatory Visit: Payer: Self-pay

## 2022-09-01 ENCOUNTER — Encounter (HOSPITAL_COMMUNITY): Payer: Self-pay

## 2022-09-01 DIAGNOSIS — Z046 Encounter for general psychiatric examination, requested by authority: Secondary | ICD-10-CM | POA: Diagnosis not present

## 2022-09-01 DIAGNOSIS — Z87891 Personal history of nicotine dependence: Secondary | ICD-10-CM | POA: Insufficient documentation

## 2022-09-01 DIAGNOSIS — F3162 Bipolar disorder, current episode mixed, moderate: Secondary | ICD-10-CM

## 2022-09-01 DIAGNOSIS — Z79899 Other long term (current) drug therapy: Secondary | ICD-10-CM | POA: Insufficient documentation

## 2022-09-01 DIAGNOSIS — R259 Unspecified abnormal involuntary movements: Secondary | ICD-10-CM | POA: Diagnosis present

## 2022-09-01 DIAGNOSIS — F319 Bipolar disorder, unspecified: Secondary | ICD-10-CM

## 2022-09-01 DIAGNOSIS — I10 Essential (primary) hypertension: Secondary | ICD-10-CM | POA: Insufficient documentation

## 2022-09-01 DIAGNOSIS — Z760 Encounter for issue of repeat prescription: Secondary | ICD-10-CM | POA: Diagnosis not present

## 2022-09-01 MED ORDER — GABAPENTIN 300 MG PO CAPS
400.0000 mg | ORAL_CAPSULE | Freq: Once | ORAL | Status: AC
  Administered 2022-09-01: 400 mg via ORAL
  Filled 2022-09-01: qty 1

## 2022-09-01 MED ORDER — TRAZODONE HCL 150 MG PO TABS: 150.0000 mg | ORAL_TABLET | Freq: Every day | ORAL | 3 refills | Status: DC

## 2022-09-01 MED ORDER — TRAZODONE HCL 50 MG PO TABS
100.0000 mg | ORAL_TABLET | Freq: Every day | ORAL | Status: DC
  Administered 2022-09-01 – 2022-09-02 (×2): 100 mg via ORAL
  Filled 2022-09-01 (×2): qty 2

## 2022-09-01 MED ORDER — QUETIAPINE FUMARATE 100 MG PO TABS
300.0000 mg | ORAL_TABLET | Freq: Every day | ORAL | Status: DC
  Administered 2022-09-01 – 2022-09-02 (×2): 300 mg via ORAL
  Filled 2022-09-01 (×2): qty 3

## 2022-09-01 MED ORDER — QUETIAPINE FUMARATE 300 MG PO TABS: 300.0000 mg | ORAL_TABLET | Freq: Every day | ORAL | 3 refills | Status: DC

## 2022-09-01 NOTE — ED Notes (Signed)
Dr Valeta Harms notified of the patient's refusal to allow for lab draw.

## 2022-09-01 NOTE — ED Notes (Signed)
Pt given two sandwich bags and 4 apple juice cups.

## 2022-09-01 NOTE — ED Notes (Signed)
Pt dressed in scrubs, belongings placed in locker 4.

## 2022-09-01 NOTE — ED Notes (Signed)
Ivc paperwork done copies made original in the red folder  paperwork attached to the clipboard in green zone  until pt get moved from triage atr02

## 2022-09-01 NOTE — ED Provider Notes (Signed)
Clinical Course as of 09/01/22 1809  Mon Sep 01, 2022  1756 Called family. Destroying things around the house. Singing songs and responding to internal stimuli.  Aggressive behavior. Disorganized.  Not sleeping. Today got in fights with family and made homicidal threaths [CC]    Clinical Course User Index [CC] Tretha Sciara, MD   I was approached by triage team for possible clearance of an IVC.  I discussed the case with the patient.  She states that she is having difficulties with her family members because they "do not understand her".  She states that she has not taken any of her meds for her bipolar disorder because her prescriptions were denied.  She states she was supposed to follow-up with her psychiatrist but has not done so in over 8 months. Given the IVC filed by family, I called the patient's mother for more collateral history.  The mother states that she has had aggressive behavior with worsening disorganization over the last week.  She has been destroying things around the house, she has been singing at full volume overnight not sleeping and making homicidal threats to family members.  The patient's father-in-law filed an IVC today because of these behaviors and patient's refusal to follow-up with a psychiatrist which the patient's mother has been trying to get her to do all week. This constellation of symptoms warrants further care and management, evaluation by psychiatry.     Tretha Sciara, MD 09/01/22 1810

## 2022-09-01 NOTE — ED Notes (Signed)
Patient made aware that a blood and urine sample are still needed. Dr Valeta Harms made aware that the patient is refusing for now. Dr Valeta Harms states that we can wait to obtain samples until TTS is completed.

## 2022-09-01 NOTE — ED Provider Triage Note (Signed)
Emergency Medicine Provider Triage Evaluation Note  Jeanette Hopkins , a 52 y.o. female  was evaluated in triage.  Pt complains of getting into a fight with her family earlier today, patient with history of bipolar, per mother she has been noncompliant with her medication for over a week, they told her that she has not been seen in 8 months and needs to schedule another appointment for evaluation of her symptoms prior to refill, but has refused to make an appointment.  In triage the patient is very reasonable, with no SI, HI, and speech is linear, due to conflicting information from 2 sides I think that the most appropriate would be to keep patient for psychiatric evaluation and defer to the psych team for clearance.  Review of Systems  Positive: Anger, outburst, psychosis Negative: Si, hi, avh  Physical Exam  BP (!) 139/96   Pulse 73   Temp 98.2 F (36.8 C) (Oral)   Resp 18   SpO2 100%  Gen:   Awake, no distress   Resp:  Normal effort  MSK:   Moves extremities without difficulty  Other:  Possible mild pressured speech, but otherwise answers questions appropriately  Medical Decision Making  Medically screening exam initiated at 6:13 PM.  Appropriate orders placed.  AVALYNNE DIVER was informed that the remainder of the evaluation will be completed by another provider, this initial triage assessment does not replace that evaluation, and the importance of remaining in the ED until their evaluation is complete.  Workup initiated in triage    Anselmo Pickler, [place] 09/01/22 1813

## 2022-09-01 NOTE — ED Provider Notes (Signed)
[facility] EMERGENCY DEPARTMENT Provider Note   CSN: [id number] Arrival date & time: 09/01/22  1630     History No chief complaint on file.   HPI Jeanette Hopkins is a 52 y.o. female presenting for IVC. See same-day note for complete history Patient's recorded medical, surgical, social, medication list and allergies were reviewed in the Snapshot window as part of the initial history.   Review of Systems   Review of Systems  Constitutional:  Negative for chills and fever.  HENT:  Negative for ear pain and sore throat.   Eyes:  Negative for pain and visual disturbance.  Respiratory:  Negative for cough and shortness of breath.   Cardiovascular:  Negative for chest pain and palpitations.  Gastrointestinal:  Negative for abdominal pain and vomiting.  Genitourinary:  Negative for dysuria and hematuria.  Musculoskeletal:  Negative for arthralgias and back pain.  Skin:  Negative for color change and rash.  Neurological:  Negative for seizures and syncope.  All other systems reviewed and are negative.   Physical Exam Updated Vital Signs BP (!) 139/96   Pulse 73   Temp 98.2 F (36.8 C) (Oral)   Resp 18   SpO2 100%  Physical Exam Vitals and nursing note reviewed.  Constitutional:      General: She is not in acute distress.    Appearance: She is well-developed.  HENT:     Head: Normocephalic and atraumatic.  Eyes:     Conjunctiva/sclera: Conjunctivae normal.  Cardiovascular:     Rate and Rhythm: Normal rate and regular rhythm.     Heart sounds: No murmur heard. Pulmonary:     Effort: Pulmonary effort is normal. No respiratory distress.     Breath sounds: Normal breath sounds.  Abdominal:     Palpations: Abdomen is soft.     Tenderness: There is no abdominal tenderness.  Musculoskeletal:        General: No swelling.     Cervical back: Neck supple.  Skin:    General: Skin is warm and dry.     Capillary Refill: Capillary refill takes less than 2  seconds.  Neurological:     Mental Status: She is alert.  Psychiatric:        Mood and Affect: Mood is anxious. Affect is labile and angry.        Speech: Speech is rapid and pressured and tangential.      ED Course/ Medical Decision Making/ A&P Clinical Course as of 09/01/22 1811  Mon Sep 01, 2022  1756 Called family. Destroying things around the house. Singing songs and responding to internal stimuli.  Aggressive behavior. Disorganized.  Not sleeping. Today got in fights with family and made homicidal threaths [CC]    Clinical Course User Index [CC] Tretha Sciara, MD    Procedures Procedures   Medications Ordered in ED Medications - No data to display  Medical Decision Making:   [name] is a 52 y.o. female who presented to the ED today for psychiatric evaluation.  Patient is endorsing medication noncompliance but also aggressive behaviors with family members.  Patient does have a history of bipolar disorder for which they are on multiple medications.  They are not compliant with their medications.  On my initial exam, the pt was tangential in thought, not appropriate in affect, and overall well-appearing.  Vital signs reviewed and reassuring.  Reviewed and confirmed nursing documentation for past medical history, family history, social history.   Patient's presentation is  complicated by their history of psychiatric disease on chronic outpatient therapy or warranting close monitoring.  Patient placed on continuous vitals and telemetry monitoring while in ED which was reviewed periodically.     Initial Assessment:   This is most consistent with an acute life threatening illness. With the patient's presentation of aggressive behaviors, IVC by family members, patient warrants emergent psychiatric consultation.  Differential includes primary psychosis, substance-induced psychosis, mood disturbance.  Initial Plan:  Patient immediately placed into ED psychiatric hold  protocol including suicide precautions, elopement precautions and vital sign monitoring.    Emergent behavioral health hold signed and notarized while awaiting psychiatric consultation due to threat to self or others.  Psychiatry consulted for further evaluation once patient medically cleared.  Medical screening evaluation ordered and reviewed with no obvious medical reason to postpone psychiatric evaluation.  Patient currently IVC.  Pending psychiatric recommendations at this time.   Clinical Impression:  1. Medication refill   2. Bipolar 1 disorder, mixed, moderate ([facility])   3. Bipolar 1 disorder ([facility])      Data Unavailable   Final Clinical Impression(s) / ED Diagnoses Final diagnoses:  Medication refill  Bipolar 1 disorder ([facility])    Rx / DC Orders ED Discharge Orders          Ordered    QUEtiapine (SEROQUEL) 300 MG tablet  Daily at bedtime        09/01/22 1753    traZODone (DESYREL) 150 MG tablet  Daily at bedtime        09/01/22 1753              Tretha Sciara, MD 09/01/22 2212

## 2022-09-01 NOTE — ED Notes (Addendum)
Patient given eduction on what it means to be IVC'd. Patient verbalizes understanding. Patient reports that she WILL NOT let staff obtain labs or a urine sample on her for any reason because she is refusing all of it and if it must be done than "it can be done the hard way" according to the patient. This RN tries to provide education on what each of the labs are and the reason behind collecting them. Patient speaks up over this RN and reports that she is in her right mind that "she isn't crazy" and she is still refusing. Patient remains calm and cooperative during conversation and mood appears appropriate to circumstance. Patient provided with snack, drink, and warm blankets and verbalizes understanding on the plan of care at this time.

## 2022-09-01 NOTE — ED Triage Notes (Signed)
Patient BIB [facility] under [facility]C. [facility] states they were called to remove the patient from a home but when the caller was told they could not remove her because it was her residence the caller then petitioned for the patient to be IVC'd. Patient is calm, cooperative, and in no apparent distress.

## 2022-09-02 DIAGNOSIS — Z046 Encounter for general psychiatric examination, requested by authority: Secondary | ICD-10-CM

## 2022-09-02 HISTORY — DX: Encounter for general psychiatric examination, requested by authority: Z04.6

## 2022-09-02 LAB — I-STAT BETA HCG BLOOD, ED (MC, WL, AP ONLY): I-stat hCG, quantitative: <5 m[IU]/mL (ref <5)

## 2022-09-02 LAB — CBC
HCT: 35.9 % — ABNORMAL LOW (ref 36.0–46.0)
Hemoglobin: 11.8 g/dL — ABNORMAL LOW (ref 12.0–15.0)
MCH: 30.9 pg (ref 26.0–34.0)
MCHC: 32.9 g/dL (ref 30.0–36.0)
MCV: 94 fL (ref 80.0–100.0)
Platelets: 174 10*3/uL (ref 150–400)
RBC: 3.82 MIL/uL — ABNORMAL LOW (ref 3.87–5.11)
RDW: 13.9 % (ref 11.5–15.5)
WBC: 3.2 10*3/uL — ABNORMAL LOW (ref 4.0–10.5)
nRBC: 0 % (ref 0.0–0.2)

## 2022-09-02 LAB — BASIC METABOLIC PANEL
Anion gap: 7 (ref 5–15)
BUN: 17 mg/dL (ref 6–20)
CO2: 22 mmol/L (ref 22–32)
Calcium: 8.5 mg/dL — ABNORMAL LOW (ref 8.9–10.3)
Chloride: 107 mmol/L (ref 98–111)
Creatinine, Ser: 0.98 mg/dL (ref 0.44–1.00)
GFR, Estimated: >60 mL/min (ref >60)
Glucose, Bld: 119 mg/dL — ABNORMAL HIGH (ref 70–99)
Potassium: 3.3 mmol/L — ABNORMAL LOW (ref 3.5–5.1)
Sodium: 136 mmol/L (ref 135–145)

## 2022-09-02 LAB — SALICYLATE LEVEL: Salicylate Lvl: <7 mg/dL — ABNORMAL LOW (ref 7.0–30.0)

## 2022-09-02 LAB — ACETAMINOPHEN LEVEL: Acetaminophen (Tylenol), Serum: <10 ug/mL — ABNORMAL LOW (ref 10–30)

## 2022-09-02 LAB — ETHANOL: Alcohol, Ethyl (B): <10 mg/dL (ref <10)

## 2022-09-02 MED ORDER — HYDROXYZINE HCL 25 MG PO TABS
25.0000 mg | ORAL_TABLET | Freq: Three times a day (TID) | ORAL | Status: DC | PRN
  Administered 2022-09-02: 25 mg via ORAL
  Filled 2022-09-02: qty 1

## 2022-09-02 NOTE — ED Notes (Signed)
Charge nurse Janett Billow notified of the I-stat that has been drawn and ready to be run at this time.
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
## 2022-09-02 NOTE — ED Notes (Signed)
Sitter is at bedside at this time

## 2022-09-02 NOTE — BH Assessment (Signed)
TTS attempted to complete TTS assessment. Per Ria Comment, RN, patient medicated and asleep. TTS will attempt at later time.

## 2022-09-02 NOTE — ED Notes (Signed)
Patient reminded of the need for a urine sample at this time. Patient provided drinks. Patient reports understanding of the need for the sample of urine

## 2022-09-03 DIAGNOSIS — Z046 Encounter for general psychiatric examination, requested by authority: Secondary | ICD-10-CM | POA: Diagnosis not present

## 2022-09-03 MED ORDER — HYDROXYZINE HCL 25 MG PO TABS: 25.0000 mg | ORAL_TABLET | Freq: Four times a day (QID) | ORAL | 0 refills | Status: DC

## 2022-09-03 NOTE — ED Notes (Signed)
IVC paperwork with [facility]
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
## 2022-09-03 NOTE — Discharge Instructions (Addendum)
Schizophrenia/Mental Health Resources ?National Suicide Prevention Lifeline ?1-800-273-TALK (8255) ?http://www.suicidepreventionlifeline.org/ ??988?-Mental Health Crisis Line ? ?National Schizophrenia Foundation ?https://sczaction.org/ ?National Institute of Mental Health ?1-866-615-6464 ?nimhinfo@nih.gov (e-mail) ?www.nimh.nih.gov ?Schizophrenia & Psychosis Action Alliance ?800-493-2094 ?info@sczaction.org ?https://sczaction.org/  ?5 Additional Healthline identified ?Best online Schizophrenia Support Groups? ?Students with Psychosis ? ?Schizophrenia Spectrum Support ? ?Supportiv ($30 monthly fee)  ? ?NAMI Connection Recovery Support Group ? ?Schizophrenia Alliance ? ?Local Resources: ?Outpatient:  ? ?[facility]:  ?([phone])-890-2700 ?[address]   [place], [place]  [address] ?  ? ? ?https://www.[place].gov/services/[place]-[facility]al-health-centers#contact ? ? ?[facility]  ?[facility] ?[address] ?[place], [place] [address] ?[phone] ? ?[place]  ?[facility] ?[address] ?[place], [place]  [address] ?[phone] ?Local Resources ?(Inpatient) ? ?[facility] ?[address]  ?[place], [place] [address] ?[phone] ? ?[facility] ?Behavioral Medicine Unit and Geriatric Psychiatric Unit   ?[address]  ?[place], [place] [address] ?[phone] ? ? ?[facility]MI [facility] ?https://naminwpiedmontnc.org/support-and-education/mental-health-education/ ? ?NAMI -[place] ?https://namiguilford.org/ ? ?Community Housing and Support Resources: ? ?[facility]eless[facility] ?https://pehgc.org/ ? ?Interactive Resource Center ?https://www.interactiveresourcecenter.org/ ? ?[facility] ?https://www.greensborourbanministry.org/ ? ?[facility] [place] (adult men's shelter) ?www.opendoorministrieshp.org ?[address] ?[place], [place] [address] ?336-885-0191 ?[facility] [place] [facility] ?[address] [place], [place] [address] ?[phone] ? ? ?VA's National Homeless Call Center ?1-877-4AID VET (1-877-424-3838) ? ?Veterans Crisis Line ?1-800-273-8255 press 1 ?Confidential chat-VeteransCrisisLine.net ?Or Text to 838255 ? ?United Way ?Call 211 or 1-888-892-1162 ?www.NC211.org ? ?Affordable Housing Resources in [place] ?nchousingsearch.com   ?1-877-428-8844 ? ?Shelters ? ?[place] [facility] Housing Hotline ?336-691-9521 ?8:30am-5:30pm ?[facility] ?[address] ?[place], [place]  [address] ?336-886-5594 ?Female veterans 18+ with substance abuse issues ?Eligibility:  By Referral Only ? ?[facility] ?[address] ?[place], [place] [address] ?[phone] Ext. 347 ?Adult Men & Women ?Eligibility: Valid ID & Social Security Card ?www.greensborourbanministry.org ? ?[facility] ?[address] ?[place], [place]  [address] ?336-886-5594 ?Female veterans 18+ with substance abuse issues ?Eligibility:  By Referral Only ? ?[facility] ?[address] ?[place], [place]  [address] ?[phone] ?Single women 18+ without dependents ?Open 6pm-8am ?Eligibility:  Valid ID & Social Security Card ?Call to check availability  ?http://westendministries.org/leslieshouse.aspx ? ?[facility] ?[address] ?[place], [place]  [address] ?[phone] ?Female veterans 18+ with substance abuse/mental ?health issues ?Eligibility:  By Referral Only ? ?[facility] ?[address] ?[place], [place] [address] ?[phone] ?Call to check availability ?Males 18+ ?Eligibility: Valid ID & Social Security Card ?www.odm-hp.org ? ?[facility] [place] ?[address] ?[place], [place] [address] ?[phone] ?Women 18+ & Families with children ?Eligibility:  Valid ID  & Criminal Background Check ?www.salvationarm[facility]olinas.org/commands/[place] ? ? ?[facility] of  (Care Management): 877-566-0943 ?[facility] [place] [facility]: Behavioral Health 24 Hour Phone Line 1-800-853-5163 ?NAMI Hotline 336-370-4264 ?NAMI [place] 919-788-0801 ? ?2-1-1 Referral Service ?United Way 211 ?Call 211 or 1-888-892-1162 ?www.NC211.org ?A free United Way, 24/7 telephone information and referral service to help link citizens who are seeking help with the community resources they need. In [place] and [place], just dial 211 from your phone. ?Mental Health Association in  336-373-1402 ?Support groups for anxiety, depression and bipolar disorder, schizophrenia,   family and friends, aftermath of suicide, and mental wellness for Latinos (in Spanish). ?Mental Health Association in [place] 336-883-7480 ?Offers support groups, [facility] program offers. Psychological, vocational, educational and other rehabilitation services to those who suffer from mental health illness of the 18 and up (5 days a week/5hours a day), offer out patient services like diagnostic evaluation, comprehensive clinical assessments, individual counseling, group therapy, psycho-educational workshops, referral to other specialists, referral to a psychiatrist for an evaluation for medication, consultation, and outreach/training. ?[facility] ([facility]cohol [facility] Drug Services) ?(336) [phone] ?Substance Abuse education, prevention and treatment (detox, assessments, intensive outpatient and inpatient counseling and programs). ?[facility] ?[place] (336) 370-0195, [place] (336) [phone] ?Support groups for posttraumatic stress, depression, and schizophrenia? as well as day programs for individuals with severe mental illness. ?[facility] [place] ([phone]) [phone] ?[facility]-FREE-336-275-7896 ?[facility] 336-429-5600 or www.kellinfoundation.org ?Telehealth platform, Individual counseling across  the lifespan for both mental health and substance use, support groups, advocacy, case management, virtual villages, resource coordination. ?[facility]- 1-800-256-2452 ?[facility]-FREE-336-676-6840 or 1-800-853-5163 ?336-676-6849. Provides mental health services to all residents regardless of ability to pay. [address], [place]. [place] ?Domestic Violence Crisis Line ?[facility] ?Call for shelter and/or safety planning ?[facility] ?336-889-7273 (24/7) ?[facility] ?336-273-7273 (24/7) ? ?VA Homeless Hotline ?877-424-3838 ? ?Veterans Crisis Line ?1-800-273-8255 press 1 ?Confidential chat-VeteransCrisisLine.net ?Or Text to 838255 ? ?[facility] [place] [facility] ?[address] ?[place], [place] [address] ?[phone] ?Hours: Mon-Fri. 8am-5pm ?[facility] Mobile Crisis Management ?Mobile crisis response for mental health, substance abuse or intellectual/developmental disabilities ?1-877-626-1772 ? ? ? ? ?Disclaimer: This resource list is subject to change at any time and is a starting point for resource identification as of 08/16/2021.  ? ?

## 2022-09-03 NOTE — ED Provider Notes (Signed)
Emergency Medicine Observation Re-evaluation Note  Jeanette Hopkins is a 52 y.o. female, seen on rounds today.  Pt initially presented to the ED for complaints of Psychiatric Evaluation Currently, the patient is resting comfortably.  Physical Exam  BP 112/83 (BP Location: Left Arm)   Pulse 69   Temp 98.3 F (36.8 C) (Oral)   Resp 16   SpO2 99%  Physical Exam General: NAD Cardiac: well perfused Lungs: even and unlabored Psych: no agitation  ED Course / MDM  EKG:   I have reviewed the labs performed to date as well as medications administered while in observation.  Recent changes in the last 24 hours include evaluated by psychiatry, cleared for discharge.  Plan  Current plan is for DC.    Regan Lemming, MD 09/03/22 [phone]

## 2022-09-03 NOTE — ED Notes (Signed)
Patient has a Pharmacologist.

## 2022-10-02 ENCOUNTER — Other Ambulatory Visit (HOSPITAL_COMMUNITY): Payer: Self-pay | Admitting: Psychiatry

## 2022-10-02 DIAGNOSIS — F3162 Bipolar disorder, current episode mixed, moderate: Secondary | ICD-10-CM

## 2022-10-20 IMAGING — MR MR KNEE*L* W/O CM
6 series · 39 of 40 positions shown · non-contrast
Comparison: Left knee x-ray 06/02/2021

CLINICAL DATA: Chronic left knee pain

EXAM:
MRI OF THE LEFT KNEE WITHOUT CONTRAST
TECHNIQUE: Multiplanar, multisequence MR imaging of the knee was performed. No
intravenous contrast was administered.

[Series 13: T2 fat-sat · axial · left · 4.0mm · 0.50mm/px · z∈[-131,+26]mm · 8 of 37 slices shown (1 of 3)]
[im 1/37]
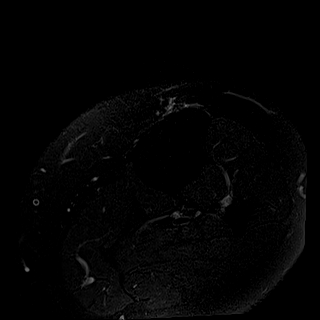
[im 6/37]
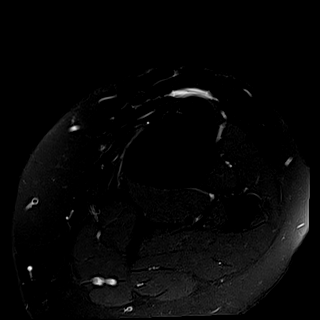
[im 11/37]
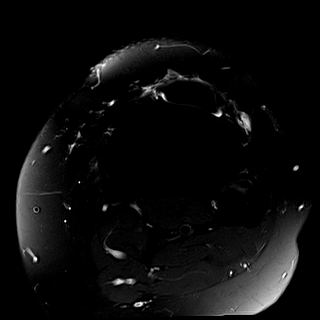
[im 16/37]
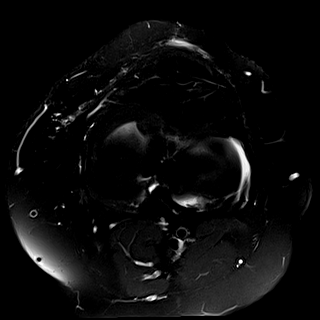
[im 21/37]
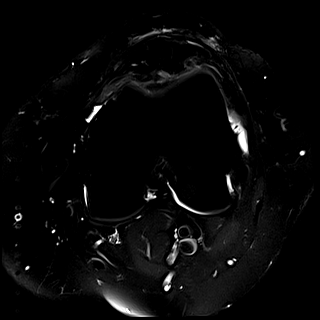
[im 26/37]
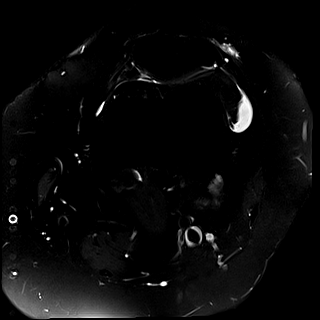
[im 31/37]
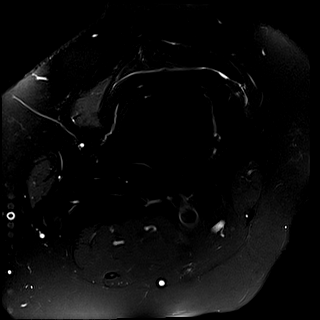
[im 37/37]
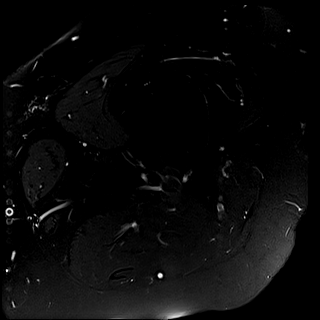

[Series 14: T1 · coronal · left · 4.0mm · 0.31mm/px · 5 of 30 slices shown]
[im 1/30]
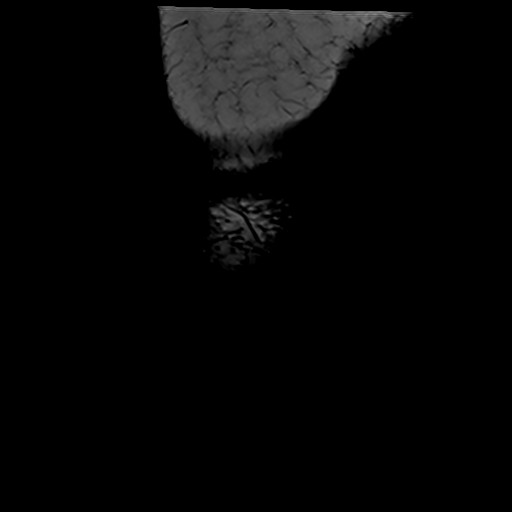
[im 6/30]
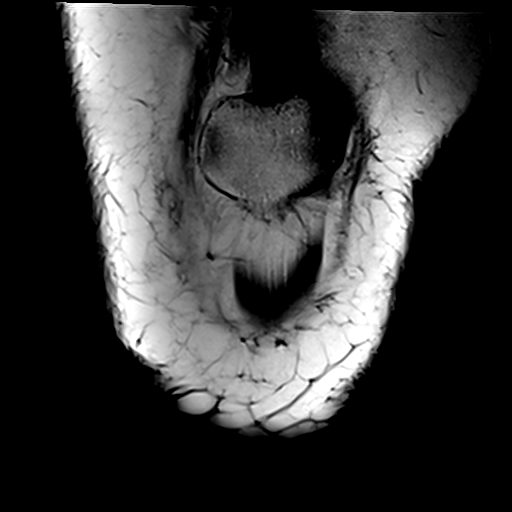
[im 12/30]
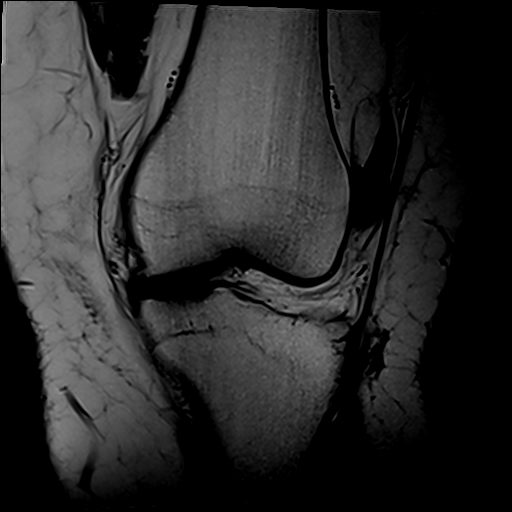
[im 18/30]
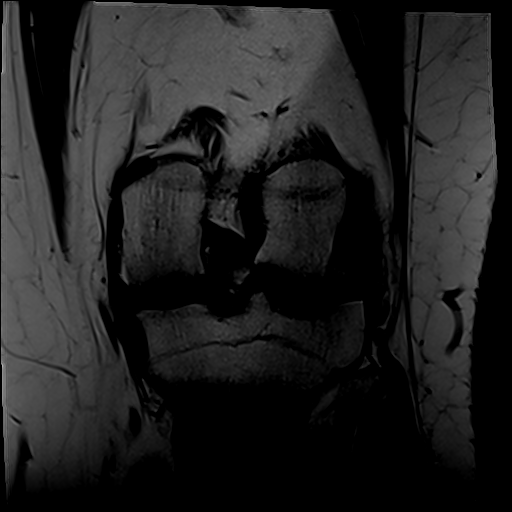
[im 24/30]
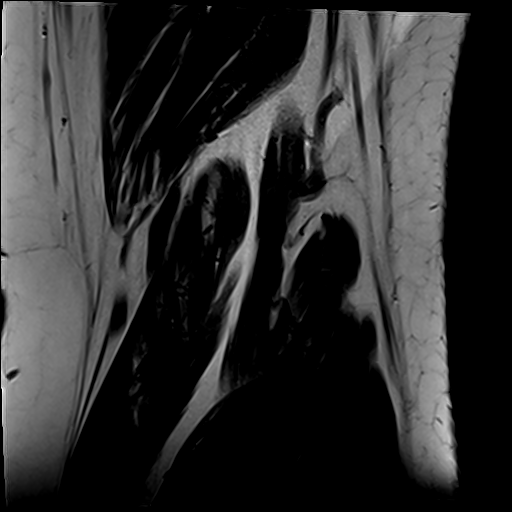

[Series 15: T2 fat-sat · coronal · left · 4.0mm · 0.59mm/px · 6 of 30 slices shown (2 of 3)]
[im 1/30]
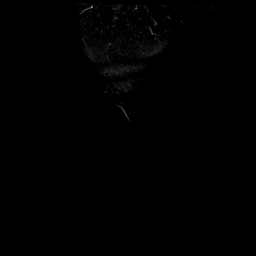
[im 6/30]
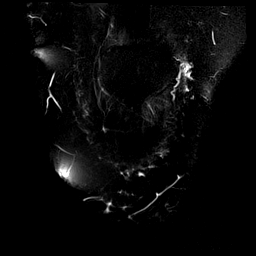
[im 12/30]
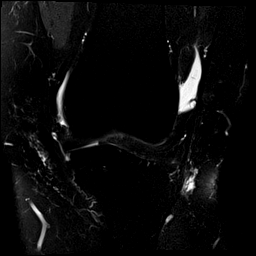
[im 18/30]
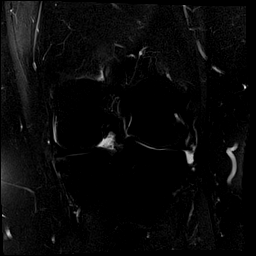
[im 24/30]
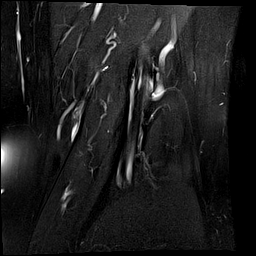
[im 30/30]
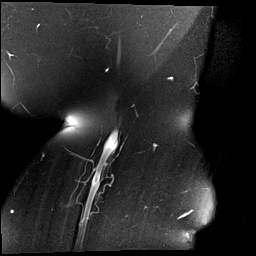

[Series 16: PD fat-sat · coronal · left · 3.0mm · 0.47mm/px · 8 of 38 slices shown (1 of 2)]
[im 1/38]
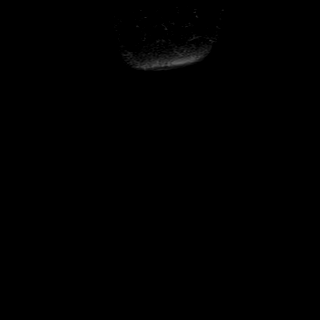
[im 6/38]
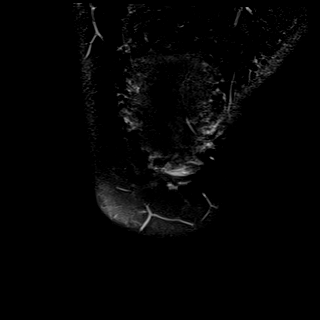
[im 11/38]
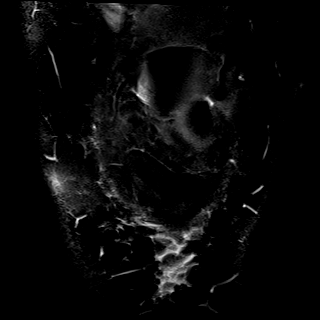
[im 16/38]
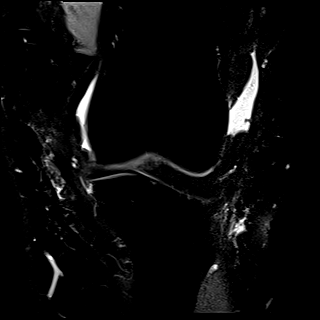
[im 22/38]
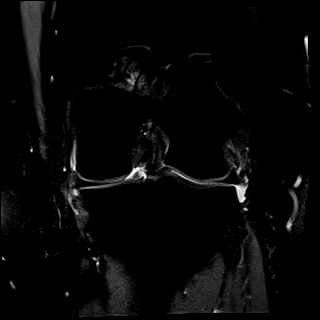
[im 27/38]
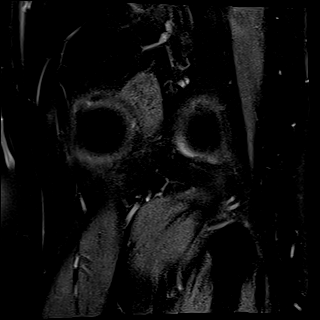
[im 32/38]
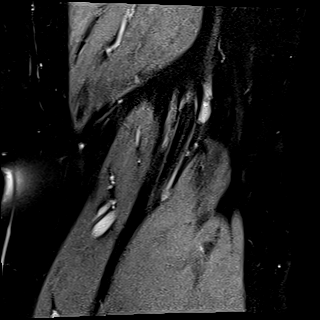
[im 38/38]
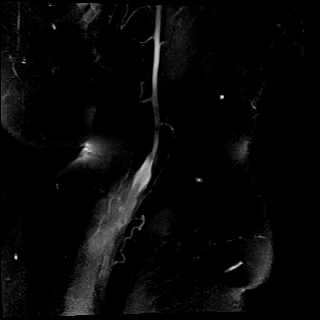

[Series 17: PD fat-sat · sagittal · left · 4.0mm · 0.49mm/px · 6 of 32 slices shown (2 of 2)]
[im 1/32]
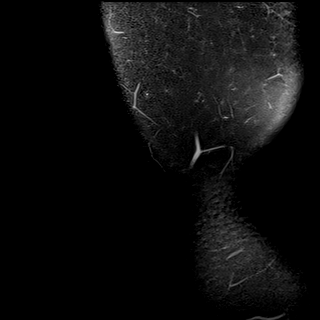
[im 7/32]
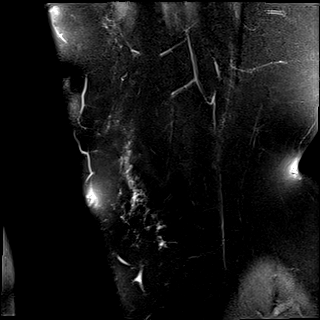
[im 13/32]
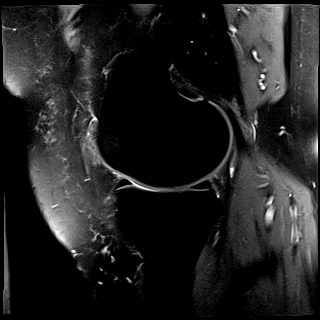
[im 19/32]
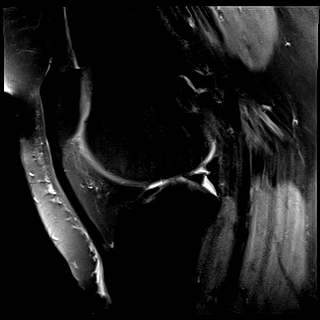
[im 25/32]
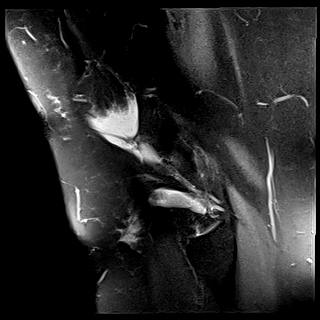
[im 32/32]
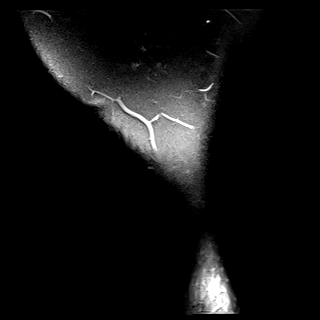

[Series 18: T2 fat-sat · sagittal · left · 4.0mm · 0.47mm/px · 6 of 32 slices shown (3 of 3)]
[im 1/32]
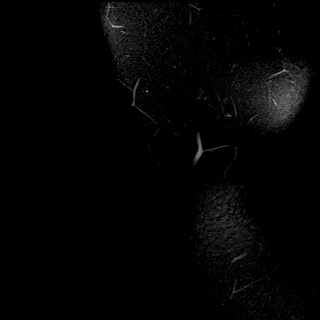
[im 7/32]
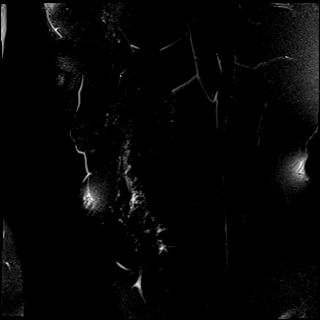
[im 13/32]
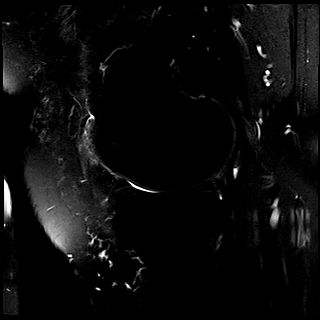
[im 19/32]
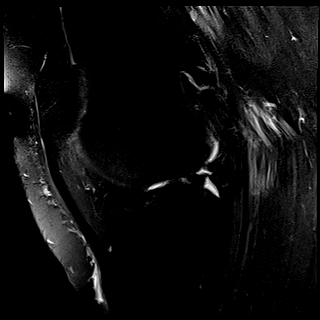
[im 25/32]
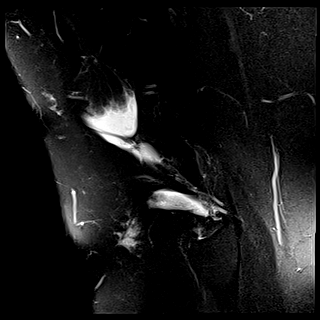
[im 32/32]
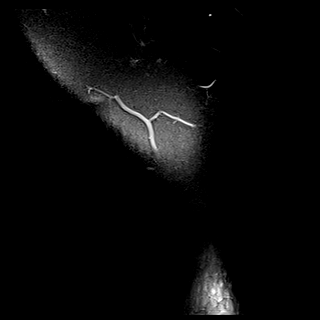

[39 of 40 positions shown; findings below may reference images not displayed]

FINDINGS: Study is somewhat limited due to body habitus and motion.

MENISCI

Medial meniscus: Intact with mild increased intrasubstance signal
posteriorly.

Lateral meniscus:  Intact.

LIGAMENTS

Cruciates:  Intact ACL and PCL.

Collaterals: Medial collateral ligament is intact. Lateral
collateral ligament complex is intact.

CARTILAGE

Patellofemoral: Mild thinning of the articular cartilage with no
large full-thickness defect.

Medial: Mild partial-thickness cartilage loss of the medial
femorotibial compartment.

Lateral:  No chondral defect.

Joint:  Small joint effusion.

Popliteal Fossa:  Trace popliteal cyst.  Popliteus tendon intact.

Extensor Mechanism:  Intact quadriceps tendon and patellar tendon.

Bones: No focal marrow signal abnormality. No fracture or
dislocation.

Other: Mild subcutaneous soft tissue edema anteriorly.
IMPRESSION: 1. No internal derangement visualized.
2. Early degenerative changes in the medial compartment and
patellofemoral joint.
3. Small joint effusion.  Trace popliteal cyst.

## 2022-10-20 IMAGING — MR MR KNEE*R* W/O CM
6 series · 39 of 40 positions shown · non-contrast
Comparison: Right knee x-ray 06/02/2021

CLINICAL DATA: Chronic right knee pain

EXAM:
MRI OF THE RIGHT KNEE WITHOUT CONTRAST
TECHNIQUE: Multiplanar, multisequence MR imaging of the knee was performed. No
intravenous contrast was administered.

[Series 5: T2 fat-sat · axial · right · 4.0mm · 0.50mm/px · z∈[-59,+80]mm · 6 of 33 slices shown (1 of 3)]
[im 1/33]
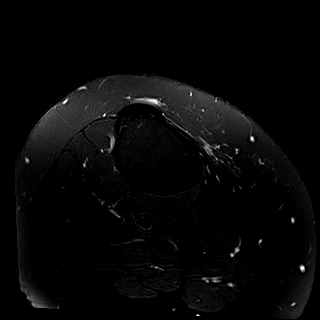
[im 7/33]
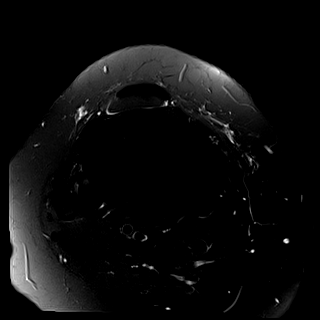
[im 13/33]
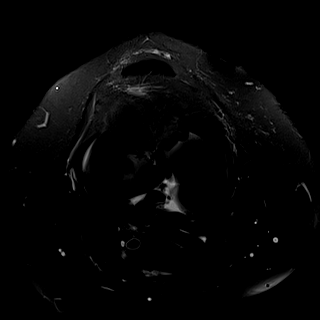
[im 20/33]
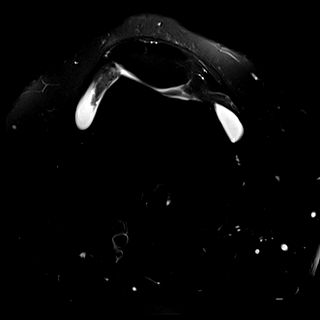
[im 26/33]
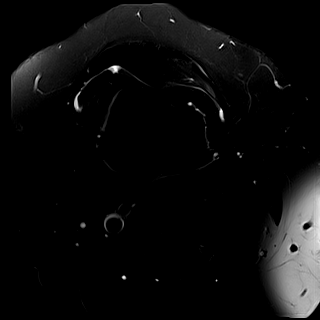
[im 33/33]
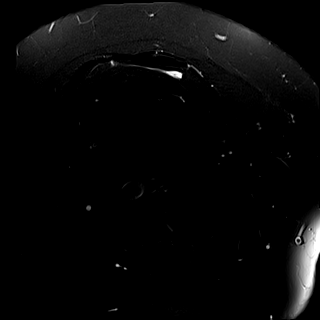

[Series 6: T1 · coronal · right · 4.0mm · 0.29mm/px · 6 of 32 slices shown]
[im 1/32]
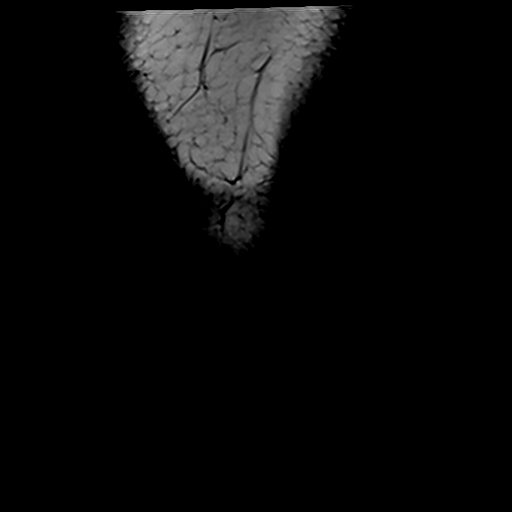
[im 6/32]
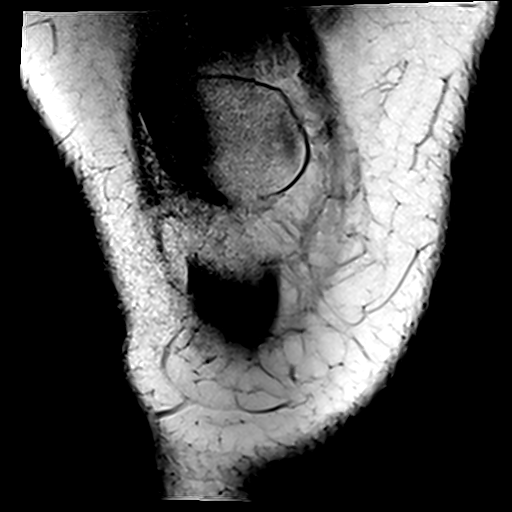
[im 11/32]
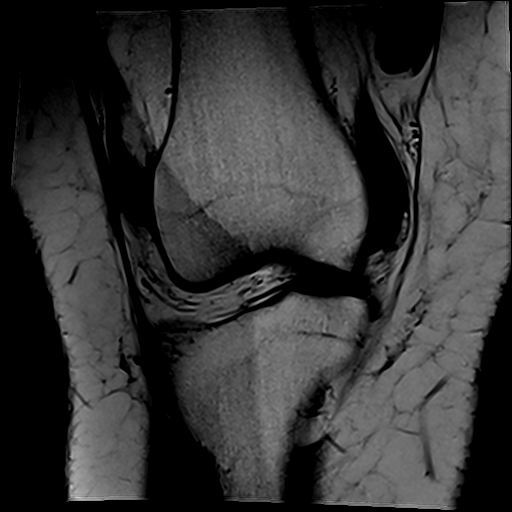
[im 16/32]
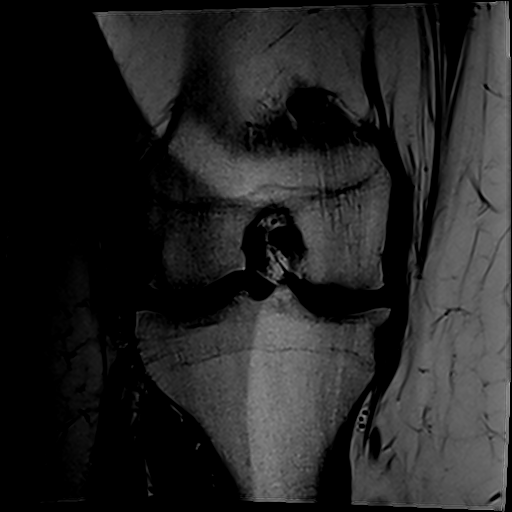
[im 21/32]
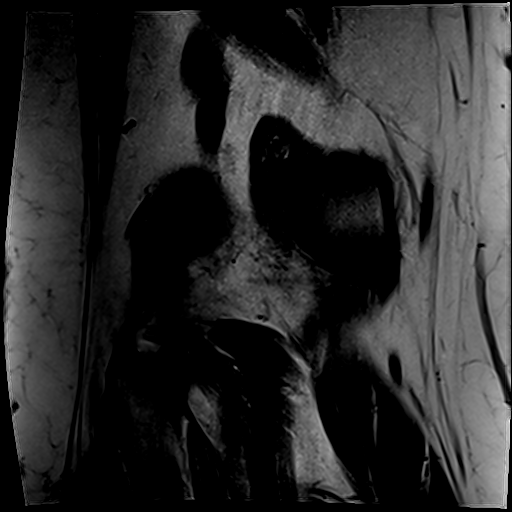
[im 26/32]
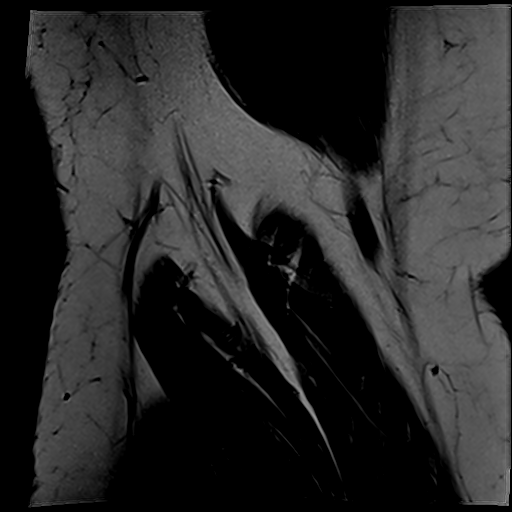

[Series 7: T2 fat-sat · coronal · right · 4.0mm · 0.59mm/px · 7 of 32 slices shown (2 of 3)]
[im 1/32]
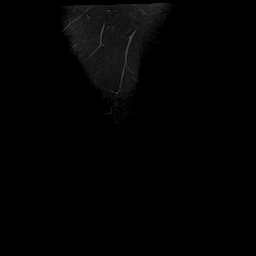
[im 6/32]
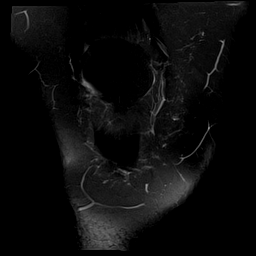
[im 11/32]
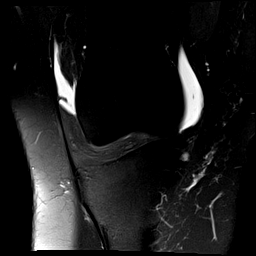
[im 16/32]
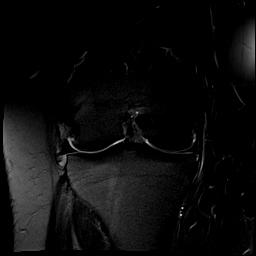
[im 21/32]
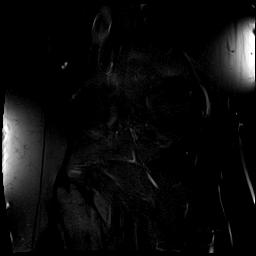
[im 26/32]
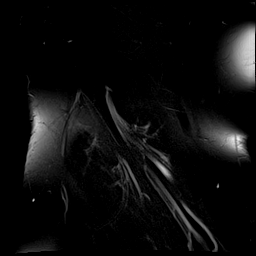
[im 32/32]
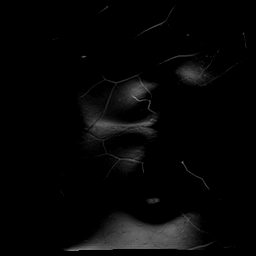

[Series 8: PD fat-sat · coronal · right · 3.0mm · 0.47mm/px · 8 of 39 slices shown (1 of 2)]
[im 1/39]
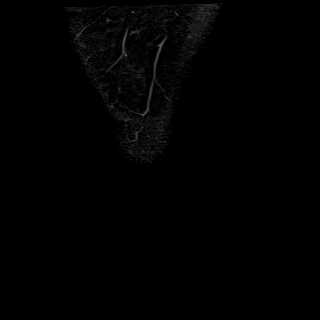
[im 6/39]
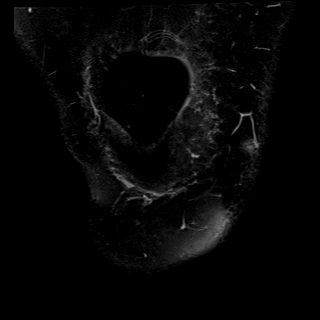
[im 11/39]
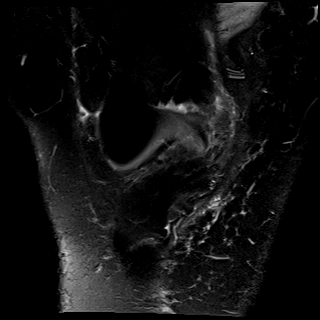
[im 17/39]
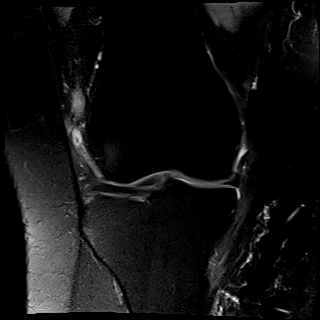
[im 22/39]
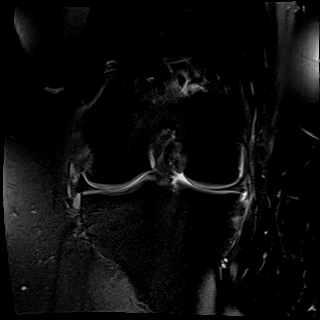
[im 28/39]
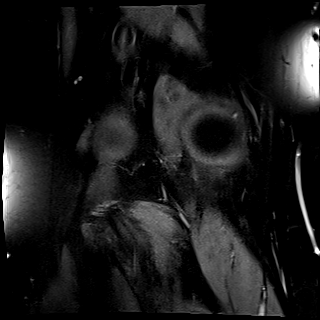
[im 33/39]
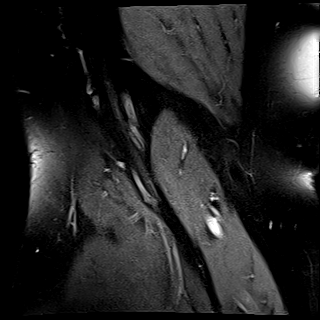
[im 39/39]
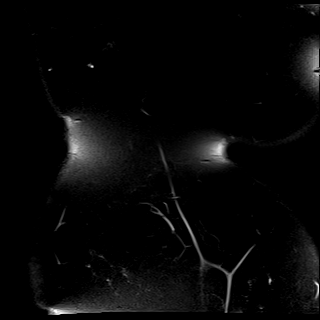

[Series 9: PD fat-sat · sagittal · right · 4.0mm · 0.47mm/px · 6 of 27 slices shown (2 of 2)]
[im 1/27]
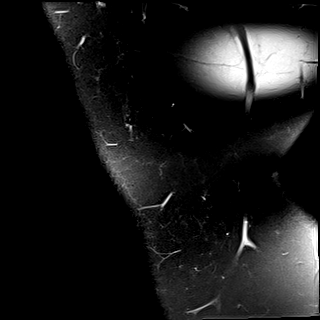
[im 6/27]
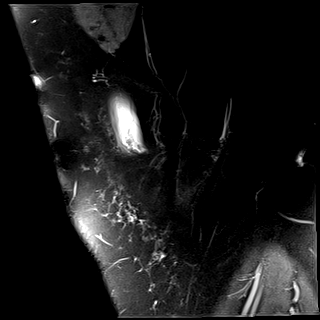
[im 11/27]
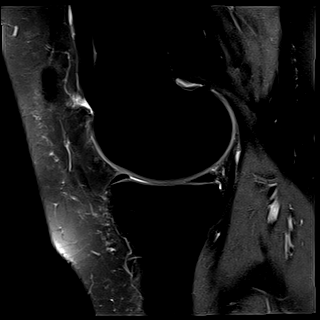
[im 16/27]
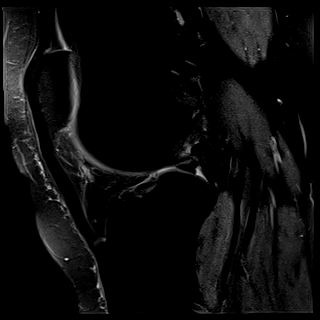
[im 21/27]
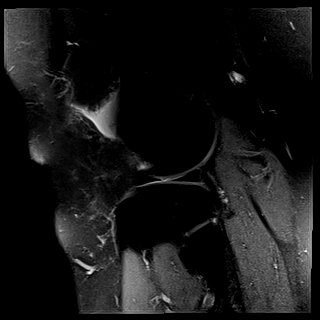
[im 27/27]
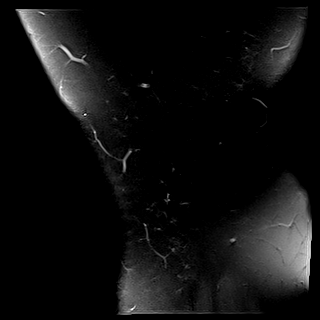

[Series 10: T2 fat-sat · sagittal · right · 4.0mm · 0.47mm/px · 6 of 27 slices shown (3 of 3)]
[im 1/27]
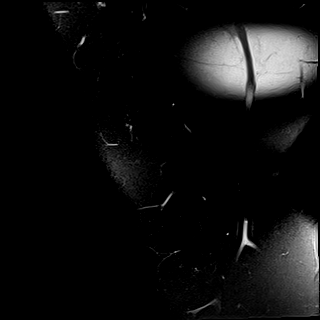
[im 6/27]
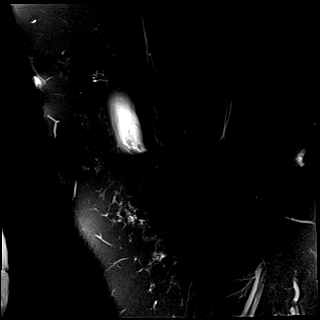
[im 11/27]
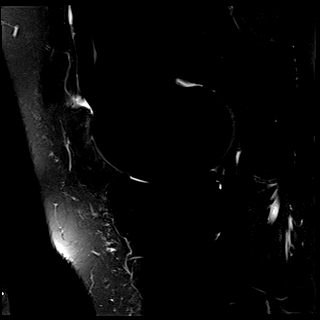
[im 16/27]
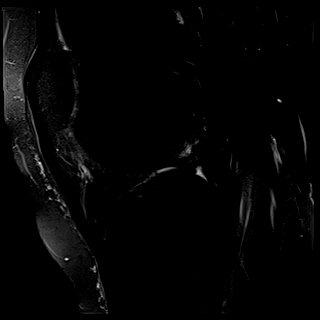
[im 21/27]
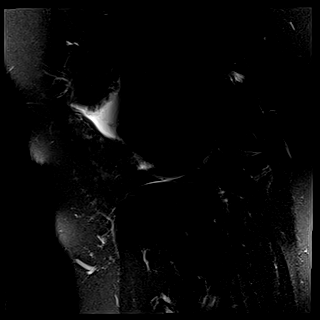
[im 27/27]
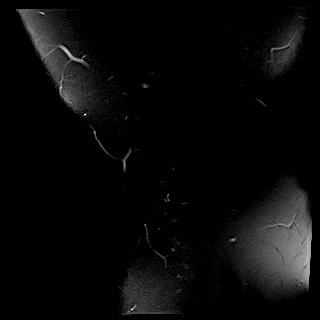

[39 of 40 positions shown; findings below may reference images not displayed]

FINDINGS: Study is limited due to body habitus and motion.

MENISCI

Medial meniscus: Intact with mild increased intrasubstance signal
posteriorly.

Lateral meniscus:  Intact.

LIGAMENTS

Cruciates: ACL appears intact with diffuse thickening and mild
increased signal. PCL is intact.

Collaterals: Medial collateral ligament is intact. Lateral
collateral ligament complex is intact.

CARTILAGE

Patellofemoral:  No chondral defect.

Medial: Mild partial-thickness cartilage loss of the medial
femorotibial compartment.

Lateral:  No chondral defect.

Joint:  Small joint effusion.

Popliteal Fossa:  Trace popliteal cyst.  Popliteus tendon intact.

Extensor Mechanism:  Intact quadriceps tendon and patellar tendon.

Bones: No focal marrow signal abnormality. No fracture or
dislocation.

Other: None.
IMPRESSION: 1. No internal derangement.
2. Early degenerative changes in the medial compartment.
3. Likely mucoid degeneration of the ACL.
4. Small joint effusion.  Trace popliteal cyst.

## 2022-11-17 ENCOUNTER — Emergency Department (HOSPITAL_COMMUNITY)
Admission: EM | Admit: 2022-11-17 | Discharge: 2022-11-18 | Disposition: A | Discharge status: Home / Self Care | Payer: Medicaid Other | Attending: Emergency Medicine | Admitting: Emergency Medicine

## 2022-11-17 ENCOUNTER — Encounter (HOSPITAL_COMMUNITY): Payer: Self-pay | Admitting: *Deleted

## 2022-11-17 ENCOUNTER — Emergency Department (HOSPITAL_COMMUNITY): Payer: Medicaid Other

## 2022-11-17 ENCOUNTER — Other Ambulatory Visit: Payer: Self-pay

## 2022-11-17 DIAGNOSIS — I10 Essential (primary) hypertension: Secondary | ICD-10-CM | POA: Diagnosis not present

## 2022-11-17 DIAGNOSIS — R519 Headache, unspecified: Secondary | ICD-10-CM | POA: Diagnosis present

## 2022-11-17 DIAGNOSIS — Z79899 Other long term (current) drug therapy: Secondary | ICD-10-CM | POA: Diagnosis not present

## 2022-11-17 DIAGNOSIS — S0240FA Zygomatic fracture, left side, initial encounter for closed fracture: Secondary | ICD-10-CM | POA: Insufficient documentation

## 2022-11-17 MED ORDER — OXYCODONE-ACETAMINOPHEN 5-325 MG PO TABS
2.0000 | ORAL_TABLET | Freq: Once | ORAL | Status: AC
  Administered 2022-11-18: 2 via ORAL
  Filled 2022-11-17: qty 2

## 2022-11-17 MED ORDER — TETANUS-DIPHTH-ACELL PERTUSSIS 5-2.5-18.5 LF-MCG/0.5 IM SUSY
0.5000 mL | PREFILLED_SYRINGE | Freq: Once | INTRAMUSCULAR | Status: DC
  Filled 2022-11-17: qty 0.5

## 2022-11-17 NOTE — ED Notes (Signed)
Called received from pt mother Jeanette Hopkins [phone] requesting rtn call from pt when possible. [employer]
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
## 2022-11-17 NOTE — ED Notes (Signed)
Pt taken to imaging
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
## 2022-11-18 MED ORDER — OXYCODONE HCL 5 MG PO TABS: 2.5000 mg | ORAL_TABLET | Freq: Four times a day (QID) | ORAL | 0 refills | Status: AC | PRN

## 2022-11-18 MED ORDER — KETOROLAC TROMETHAMINE 60 MG/2ML IM SOLN
30.0000 mg | Freq: Once | INTRAMUSCULAR | Status: DC
  Filled 2022-11-18: qty 2
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
## 2022-11-18 NOTE — Discharge Instructions (Addendum)
For pain control you may take 1000 mg of Tylenol every 8 hours scheduled.  In addition you can take 0.5 to 1 tablet of Oxycodone every 6 hours as needed for pain not controlled with the scheduled Tylenol.  

## 2023-02-02 ENCOUNTER — Ambulatory Visit (HOSPITAL_COMMUNITY)
Admission: EM | Admit: 2023-02-02 | Discharge: 2023-02-02 | Disposition: A | Discharge status: Home / Self Care | Payer: Medicaid Other | Attending: Behavioral Health | Admitting: Behavioral Health

## 2023-02-02 ENCOUNTER — Encounter (HOSPITAL_COMMUNITY): Payer: Self-pay | Admitting: Behavioral Health

## 2023-02-02 DIAGNOSIS — F319 Bipolar disorder, unspecified: Secondary | ICD-10-CM

## 2023-02-02 DIAGNOSIS — R45850 Homicidal ideations: Secondary | ICD-10-CM | POA: Insufficient documentation

## 2023-02-02 DIAGNOSIS — Z79899 Other long term (current) drug therapy: Secondary | ICD-10-CM | POA: Insufficient documentation

## 2023-02-02 DIAGNOSIS — F22 Delusional disorders: Secondary | ICD-10-CM | POA: Diagnosis present

## 2023-02-02 DIAGNOSIS — F1420 Cocaine dependence, uncomplicated: Secondary | ICD-10-CM | POA: Insufficient documentation

## 2023-02-02 DIAGNOSIS — F3162 Bipolar disorder, current episode mixed, moderate: Secondary | ICD-10-CM | POA: Insufficient documentation

## 2023-02-02 MED ORDER — QUETIAPINE FUMARATE 300 MG PO TABS
300.0000 mg | ORAL_TABLET | Freq: Every day | ORAL | 0 refills | Status: DC
Start: 2023-02-02 — End: 2023-02-23

## 2023-02-02 MED ORDER — TRAZODONE HCL 150 MG PO TABS: 150.0000 mg | ORAL_TABLET | Freq: Every day | ORAL | 0 refills | Status: DC

## 2023-02-02 NOTE — Discharge Instructions (Addendum)
*You have a medication management appointment scheduled with Dr. Doyne Keel on 02/23/23 at 2:00pm at [facility] 2nd floor Outpatient Services  *Please present to [facility] 2nd floor Outpatient Services during walk-in hours below prior to this appointment   [facility]: Outpatient psychiatric Services:   Please see the walk in hours listed below.  Medication Management New Patient needing Medication Management Walk-in, and Existing Patients needing to see a provider for management coming as a walk in   Monday thru Friday 8:00 AM first come first serve until slots are full.  Recommend being there by 7:15 AM to ensure a slot is open.  Therapy New Patient Therapy Intake and Existing Patients needing to see therapist coming in as a walk in.   Monday, Wednesday, and Thursday morning at 8:00 am first come first serve.  Recommend being there by 7:15 AM to ensure a slot is open.    Every 1st, 2nd, and 3rd Friday at 1:00 PM first come first serve until slots are full.  Will still need to come in that morning at 7:15 AM to get registered for an afternoon slot.  For all walk-ins we ask that you arrive by 7:15 am because patients will be seen in there order of arrival (FIRST COME FIRST SERVE) Availability is limited, therefore you may not be seen on the same day that you walk in if all slots are full.    Our goal is to serve and meet the needs of our community to the best of our ability.    Based on what you have shared, a list of resources for outpatient therapy and psychiatry is provided below to get you started back on treatment.  It is imperative that you follow through with treatment within 5-7 days from the day of discharge to prevent any further risk to your safety or mental well-being.  You are not limited to the list provided.  In case of an urgent crisis, you may contact the Mobile Crisis Unit with [facility] at [phone].        Outpatient Services for Therapy and Medication Management for [facility] [address] [place], [place], [address] [phone] phone  New Patient Assessment/Therapy Walk-ins Monday and Wednesday: 8am until slots are full. Every 1st and 2nd Friday: 1pm - 5pm  NO ASSESSMENT/THERAPY WALK-INS ON TUESDAYS OR THURSDAYS  New Patient Psychiatry/Medication Management Walk-ins Monday-Friday: 8am-11am  For all walk-ins, we ask that you arrive by 7:30am because patient will be seen in the order of arrival.  Availability is limited; therefore, you may not be seen on the same day that you walk-in.  Our goal is to serve and meet the needs of our community to the best of our ability.   [facility] [address] [place], [place], [address] [phone] phone  [facility] [address] [place], [place], [address] [phone] phone [phone] phone ([address], Anthem/Elevance, [address], [address], [address], [address], Healthy [place], [place], [place], [address], [employer], [place], UHC, [employer], [place], Out of Network)  [employer], [place] [address] [place], [place], [address] [phone] phone ([place], Anthem/Elevance, [employer] Options/Carelon, BCBS, [address], [place], [place], [place], [place], [employer], [place], [place], [place], [facility])  [employer] [address] [place], [place], [address] [phone] phone (Medicaid, ask about other insurance)  [facility] [address], [address] [place], [place], [address] [phone] phone [phone] fax ([address], [place] , [place], [place], [place] Health Choice, UHC, TRICARE, Self-Pay)  [facility] [address] [place], [place], [address] [phone] phone ([address], Anthem/Elevance, [address], [address], [place], [employer], [place], [place], [place], [employer], [place], [place], [place], [facility])  [facility] - 6-8 MONTH WAIT FOR THERAPY; [facility] FOR MEDICATION MANAGEMENT [address], [address] [place], [place], [address] [phone] phone ([address] AmeriHealth [address] - [place], [address], [place], [place], Friday Health Plans, [address], BCBS Healthy [place], [place], [address], [place], [place], [place], [place], Tricare, UHC, [employer], [place])  [facility] [address] [place], [place], [address] [phone] phone  [facility] [address] [place], [place], [address] [phone] phone  [facility] [address] [place], [place], [address] [phone] phone  [facility] [place] - THERAPY ONLY [address] [place], [address] [phone] phone  [facility], [place] [address], [place], [address] [phone] phone  [facility] [address] [place], [place], [address] [phone] phone [phone] fax  [facility] [address] [place], [place], [address] [phone] phone [phone] fax  [facility] [phone] [address] [place], [place], [address] [phone] phone  Jovita Kussmaul [address] [place], [place], [address]  [phone] phone  [facility]  (Adults Only) [address] [place], [place], [address]  [phone] phone [phone] fax
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
## 2023-02-23 ENCOUNTER — Encounter (HOSPITAL_COMMUNITY): Payer: Self-pay | Admitting: Psychiatry

## 2023-02-23 ENCOUNTER — Ambulatory Visit (INDEPENDENT_AMBULATORY_CARE_PROVIDER_SITE_OTHER): Payer: Medicaid Other | Admitting: Psychiatry

## 2023-02-23 VITALS — BP 143/92 | HR 52 | Temp 98.3°F | Wt 172.2 lb

## 2023-02-23 DIAGNOSIS — F411 Generalized anxiety disorder: Secondary | ICD-10-CM | POA: Diagnosis not present

## 2023-02-23 DIAGNOSIS — G2401 Drug induced subacute dyskinesia: Secondary | ICD-10-CM | POA: Diagnosis not present

## 2023-02-23 DIAGNOSIS — F1490 Cocaine use, unspecified, uncomplicated: Secondary | ICD-10-CM | POA: Diagnosis not present

## 2023-02-23 DIAGNOSIS — F3162 Bipolar disorder, current episode mixed, moderate: Secondary | ICD-10-CM

## 2023-02-23 MED ORDER — VALBENAZINE TOSYLATE 40 MG PO CAPS
40.0000 mg | ORAL_CAPSULE | Freq: Every day | ORAL | 3 refills | Status: DC
Start: 2023-02-23 — End: 2023-03-17

## 2023-02-23 MED ORDER — QUETIAPINE FUMARATE 400 MG PO TABS
400.0000 mg | ORAL_TABLET | Freq: Every day | ORAL | 3 refills | Status: DC
Start: 2023-02-23 — End: 2023-03-17

## 2023-02-23 MED ORDER — PROPRANOLOL HCL 10 MG PO TABS
10.0000 mg | ORAL_TABLET | Freq: Three times a day (TID) | ORAL | 3 refills | Status: DC
Start: 2023-02-23 — End: 2023-10-30

## 2023-02-23 MED ORDER — TRAZODONE HCL 150 MG PO TABS
150.0000 mg | ORAL_TABLET | Freq: Every day | ORAL | 0 refills | Status: DC
Start: 2023-02-23 — End: 2023-03-17
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
## 2023-02-25 ENCOUNTER — Telehealth (HOSPITAL_COMMUNITY): Payer: Self-pay | Admitting: *Deleted

## 2023-02-25 NOTE — Telephone Encounter (Signed)
Looked online with cover my meds for authorization of Ingrezza. Approved. Notified pharmacy.

## 2023-03-17 ENCOUNTER — Telehealth (HOSPITAL_COMMUNITY): Payer: Self-pay | Admitting: Psychiatry

## 2023-03-17 ENCOUNTER — Other Ambulatory Visit (HOSPITAL_COMMUNITY): Payer: Self-pay | Admitting: Psychiatry

## 2023-03-17 DIAGNOSIS — G2401 Drug induced subacute dyskinesia: Secondary | ICD-10-CM

## 2023-03-17 DIAGNOSIS — F3162 Bipolar disorder, current episode mixed, moderate: Secondary | ICD-10-CM

## 2023-03-17 DIAGNOSIS — F411 Generalized anxiety disorder: Secondary | ICD-10-CM

## 2023-03-17 MED ORDER — TRAZODONE HCL 150 MG PO TABS
150.0000 mg | ORAL_TABLET | Freq: Every day | ORAL | 0 refills | Status: DC
Start: 2023-03-17 — End: 2023-03-17

## 2023-03-17 MED ORDER — VALBENAZINE TOSYLATE 40 MG PO CAPS: 40.0000 mg | ORAL_CAPSULE | Freq: Every day | ORAL | 3 refills | Status: DC

## 2023-03-17 MED ORDER — TRAZODONE HCL 150 MG PO TABS
150.0000 mg | ORAL_TABLET | Freq: Every day | ORAL | 0 refills | Status: DC
Start: 2023-03-17 — End: 2023-04-13

## 2023-03-17 MED ORDER — QUETIAPINE FUMARATE 400 MG PO TABS
400.0000 mg | ORAL_TABLET | Freq: Every day | ORAL | 3 refills | Status: DC
Start: 2023-03-17 — End: 2023-10-30

## 2023-03-17 NOTE — Telephone Encounter (Signed)
Provider unable to disclose patient's information to her mother as she was not on her release of information form.  Patient mother reports that her daughter ran out of her medication and is due for court today.  She reports that she is more irritable and out of control.  She denies her her being a danger to herself or others.  She notes that she would like her to have her Seroquel as she is better managed when medicated.  Provider again did not confirm patient information but provided general information how to navigate crisis services by coming into [facility], coming to [facility] behavioral health outpatient services, calling the authorities if needed, filling IVC paperwork at [employer] office.  Patient's last prescribed medications was also sent to her preferred pharmacy.  No other concerns noted at this time.

## 2023-03-29 ENCOUNTER — Emergency Department (HOSPITAL_COMMUNITY): Payer: Medicaid Other

## 2023-03-29 ENCOUNTER — Emergency Department (HOSPITAL_COMMUNITY)
Admission: EM | Admit: 2023-03-29 | Discharge: 2023-03-29 | Disposition: A | Discharge status: Home / Self Care | Payer: Medicaid Other | Attending: Emergency Medicine | Admitting: Emergency Medicine

## 2023-03-29 ENCOUNTER — Other Ambulatory Visit: Payer: Self-pay

## 2023-03-29 DIAGNOSIS — R002 Palpitations: Secondary | ICD-10-CM | POA: Insufficient documentation

## 2023-03-29 DIAGNOSIS — Z79899 Other long term (current) drug therapy: Secondary | ICD-10-CM | POA: Diagnosis not present

## 2023-03-29 DIAGNOSIS — I10 Essential (primary) hypertension: Secondary | ICD-10-CM | POA: Insufficient documentation

## 2023-03-29 DIAGNOSIS — R55 Syncope and collapse: Secondary | ICD-10-CM | POA: Insufficient documentation

## 2023-03-29 LAB — CBG MONITORING, ED: Glucose-Capillary: 76 mg/dL (ref 70–99)

## 2023-03-29 LAB — BASIC METABOLIC PANEL
Anion gap: 11 (ref 5–15)
BUN: 19 mg/dL (ref 6–20)
CO2: 27 mmol/L (ref 22–32)
Calcium: 9.1 mg/dL (ref 8.9–10.3)
Chloride: 103 mmol/L (ref 98–111)
Creatinine, Ser: 1.39 mg/dL — ABNORMAL HIGH (ref 0.44–1.00)
GFR, Estimated: 46 mL/min — ABNORMAL LOW (ref >60)
Glucose, Bld: 62 mg/dL — ABNORMAL LOW (ref 70–99)
Potassium: 3.4 mmol/L — ABNORMAL LOW (ref 3.5–5.1)
Sodium: 141 mmol/L (ref 135–145)

## 2023-03-29 LAB — CBC
HCT: 36.2 % (ref 36.0–46.0)
Hemoglobin: 12.3 g/dL (ref 12.0–15.0)
MCH: 31 pg (ref 26.0–34.0)
MCHC: 34 g/dL (ref 30.0–36.0)
MCV: 91.2 fL (ref 80.0–100.0)
Platelets: 183 10*3/uL (ref 150–400)
RBC: 3.97 MIL/uL (ref 3.87–5.11)
RDW: 13 % (ref 11.5–15.5)
WBC: 3.2 10*3/uL — ABNORMAL LOW (ref 4.0–10.5)
nRBC: 0 % (ref 0.0–0.2)

## 2023-03-29 LAB — CK: Total CK: 106 U/L (ref 38–234)

## 2023-03-29 LAB — URINALYSIS, ROUTINE W REFLEX MICROSCOPIC
Bilirubin Urine: NEGATIVE
Glucose, UA: NEGATIVE mg/dL
Hgb urine dipstick: NEGATIVE
Ketones, ur: NEGATIVE mg/dL
Leukocytes,Ua: NEGATIVE
Nitrite: NEGATIVE
Protein, ur: NEGATIVE mg/dL
Specific Gravity, Urine: 1.024 (ref 1.005–1.030)
pH: 5 (ref 5.0–8.0)

## 2023-03-29 LAB — RAPID URINE DRUG SCREEN, HOSP PERFORMED
Amphetamines: NOT DETECTED
Barbiturates: NOT DETECTED
Benzodiazepines: NOT DETECTED
Cocaine: POSITIVE — AB
Opiates: NOT DETECTED
Tetrahydrocannabinol: NOT DETECTED

## 2023-03-29 MED ORDER — OXYCODONE-ACETAMINOPHEN 5-325 MG PO TABS
1.0000 | ORAL_TABLET | Freq: Once | ORAL | Status: AC
  Administered 2023-03-29: 1 via ORAL
  Filled 2023-03-29: qty 1

## 2023-03-29 MED ORDER — SODIUM CHLORIDE 0.9 % IV BOLUS
1000.0000 mL | Freq: Once | INTRAVENOUS | Status: AC
  Administered 2023-03-29: 1000 mL via INTRAVENOUS

## 2023-03-29 MED ORDER — ACETAMINOPHEN 500 MG PO TABS
1000.0000 mg | ORAL_TABLET | Freq: Once | ORAL | Status: DC
  Filled 2023-03-29: qty 2

## 2023-03-29 MED ORDER — ONDANSETRON HCL 4 MG/2ML IJ SOLN
4.0000 mg | Freq: Once | INTRAMUSCULAR | Status: AC
  Administered 2023-03-29: 4 mg via INTRAVENOUS
  Filled 2023-03-29: qty 2
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
## 2023-03-29 NOTE — ED Provider Notes (Signed)
[place] EMERGENCY DEPARTMENT AT [facility] Provider Note   CSN: [id number] Arrival date & time: 03/29/23  1934     History  Chief Complaint  Patient presents with   Palpitations   Loss of Consciousness    Jeanette Hopkins is a 52 y.o. female.  The history is provided by the patient and medical records. No language interpreter was used.  Palpitations Associated symptoms: syncope   Loss of Consciousness Associated symptoms: palpitations      52 year old female significant history of bipolar disorder, hypertension, anemia, brought here via EMS from home for evaluation of a syncopal episode.  Patient reports yesterday afternoon while she was sitting on the commode to urinate she felt very dizzy and subsequently had a syncopal episode.  She found herself on the floor when her brother knocked on the door and states that patient initially was unconscious on the ground with eyes rolled upward and trying to swallow her tongue.  She did not endorse any tongue biting or urinary or bowel incontinence.  She is not complaining of pain to the back of her head as well as achiness throughout her whole entire body.  She also endorsed feeling nauseous without vomiting.  She mention she has had multiple similar syncopal episodes like this in the past unsure what the cause of it.  She did report recent change in her psychiatric medication 2 weeks ago.  She admits to cocaine use 4 days ago.  She does not endorse any active chest pain or confusion.  Denies heart palpitation no urinary symptoms no runny nose sneezing or coughing no vomiting or diarrhea.  Home Medications Prior to Admission medications   Medication Sig Start Date End Date Taking? Authorizing Provider  cyclobenzaprine (FLEXERIL) 10 MG tablet Take 1 tablet (10 mg total) by mouth 2 (two) times daily as needed for muscle spasms. Patient not taking: Reported on 09/02/2022 06/26/22   Debby Freiberg, NP  metoprolol tartrate  (LOPRESSOR) 100 MG tablet Take 1 tablet (100 mg total) by mouth once for 1 dose. Take 90-120 minutes prior to scan. Patient not taking: Reported on 09/02/2022 04/29/22 04/29/22  Orbie Pyo, MD  metroNIDAZOLE (FLAGYL) 500 MG tablet Take 1 tablet (500 mg total) by mouth 2 (two) times daily. Patient not taking: Reported on 09/02/2022 07/16/22   Merrilee Jansky, MD  predniSONE (DELTASONE) 20 MG tablet Take 2 tablets (40 mg total) by mouth daily. Patient not taking: Reported on 09/02/2022 06/26/22   Debby Freiberg, NP  propranolol (INDERAL) 10 MG tablet Take 1 tablet (10 mg total) by mouth 3 (three) times daily. 02/23/23   Shanna Cisco, NP  QUEtiapine (SEROQUEL) 400 MG tablet Take 1 tablet (400 mg total) by mouth at bedtime. 03/17/23   Shanna Cisco, NP  traZODone (DESYREL) 150 MG tablet Take 1 tablet (150 mg total) by mouth at bedtime. 03/17/23   Shanna Cisco, NP  valbenazine [facility]) 40 MG capsule Take 1 capsule (40 mg total) by mouth daily. 03/17/23   Shanna Cisco, NP      Allergies    Patient has no known allergies.    Review of Systems   Review of Systems  Cardiovascular:  Positive for palpitations and syncope.  All other systems reviewed and are negative.   Physical Exam Updated Vital Signs BP 104/65 (BP Location: Right Arm)   Pulse 72   Temp 98.4 F (36.9 C) (Oral)   Resp 16   Ht 5\' 9"  (1.753  m)   Wt 90.7 kg   SpO2 100%   BMI 29.53 kg/m  Physical Exam Vitals and nursing note reviewed.  Constitutional:      General: She is not in acute distress.    Appearance: She is well-developed.  HENT:     Head: Normocephalic.     Comments: Small skin tear and tenderness noted to right mastoid region Eyes:     Conjunctiva/sclera: Conjunctivae normal.  Cardiovascular:     Rate and Rhythm: Normal rate and regular rhythm.     Pulses: Normal pulses.     Heart sounds: Normal heart sounds.  Pulmonary:     Effort: Pulmonary effort is normal.  Abdominal:      Palpations: Abdomen is soft.     Tenderness: There is no abdominal tenderness.  Musculoskeletal:        General: Normal range of motion.     Cervical back: Neck supple.  Skin:    Findings: No rash.  Neurological:     Mental Status: She is alert and oriented to person, place, and time.  Psychiatric:        Mood and Affect: Mood normal.     ED Results / Procedures / Treatments   Labs (all labs ordered are listed, but only abnormal results are displayed) Labs Reviewed  BASIC METABOLIC PANEL  CBC  URINALYSIS, ROUTINE W REFLEX MICROSCOPIC  RAPID URINE DRUG SCREEN, HOSP PERFORMED  CK  CBG MONITORING, ED    EKG None  Date: 03/29/2023  Rate: 74  Rhythm: normal sinus rhythm  QRS Axis: normal  Intervals: normal  ST/T Wave abnormalities: normal  Conduction Disutrbances: none  Narrative Interpretation:   Old EKG Reviewed: No significant changes noted    Radiology CT HEAD WO CONTRAST  Result Date: 03/29/2023 CLINICAL DATA:  Recent syncopal episode, initial encounter EXAM: CT HEAD WITHOUT CONTRAST TECHNIQUE: Contiguous axial images were obtained from the base of the skull through the vertex without intravenous contrast. RADIATION DOSE REDUCTION: This exam was performed according to the departmental dose-optimization program which includes automated exposure control, adjustment of the mA and/or kV according to patient size and/or use of iterative reconstruction technique. COMPARISON:  None Available. FINDINGS: Brain: No evidence of acute infarction, hemorrhage, hydrocephalus, extra-axial collection or mass lesion/mass effect. Vascular: No hyperdense vessel or unexpected calcification. Skull: Normal. Negative for fracture or focal lesion. Sinuses/Orbits: No acute finding. Other: None. IMPRESSION: No acute intracranial abnormality noted. Electronically Signed   By: Alcide Clever M.D.   On: 03/29/2023 20:33    Procedures Procedures    Medications Ordered in ED Medications - No data  to display  ED Course/ Medical Decision Making/ A&P Clinical Course as of 03/29/23 2101  Sun Mar 29, 2023  2100 Stable syncope  Likely vasovagal  CT Head ok. Small abrasion. Labs. Dispo. [CC]    Clinical Course User Index [CC] Glyn Ade, MD                                 Medical Decision Making Amount and/or Complexity of Data Reviewed Labs: ordered. Radiology: ordered.  Risk Prescription drug management.   BP 104/65 (BP Location: Right Arm)   Pulse 72   Temp 98.4 F (36.9 C) (Oral)   Resp 16   Ht 5\' 9"  (1.753 m)   Wt 90.7 kg   SpO2 100%   BMI 29.53 kg/m   39:40 PM  52 year old female significant history of  bipolar disorder, hypertension, anemia, brought here via EMS from home for evaluation of a syncopal episode.  Patient reports yesterday afternoon while she was sitting on the commode to urinate she felt very dizzy and subsequently had a syncopal episode.  She found herself on the floor when her brother knocked on the door and states that patient initially was unconscious on the ground with eyes rolled upward and trying to swallow her tongue.  She did not endorse any tongue biting or urinary or bowel incontinence.  She is not complaining of pain to the back of her head as well as achiness throughout her whole entire body.  She also endorsed feeling nauseous without vomiting.  She mention she has had multiple similar syncopal episodes like this in the past unsure what the cause of it.  She did report recent change in her psychiatric medication 2 weeks ago.  She admits to cocaine use 4 days ago.  She does not endorse any active chest pain or confusion.  Denies heart palpitation no urinary symptoms no runny nose sneezing or coughing no vomiting or diarrhea.  On exam patient is resting comfortably in bed appears to be in no acute discomfort.  She does have some point tenderness noted to her right scalp just posterior to her right earlobe.  There is a small skin tear there  with a small scab.  No significant ecchymosis no crepitus.  Heart with normal rate and rhythm, lungs clear to auscultation bilaterally abdomen is soft nontender, equal strength throughout.  No evidence of tongue injury.  Patient is alert and oriented x 4.   -Labs ordered, and currently pending -The patient was maintained on a cardiac monitor.  I personally viewed and interpreted the cardiac monitored which showed an underlying rhythm of: NSR -Imaging independently viewed and interpreted by me and I agree with radiologist's interpretation.  Result remarkable for head CT without concerning finding -This patient presents to the ED for concern of syncope, this involves an extensive number of treatment options, and is a complaint that carries with it a high risk of complications and morbidity.  The differential diagnosis includes cardiac arrhythmia, anemia, hypovolemia, electrolytes imbalance, stroke, seizure, MI, vasovagal -Co morbidities that complicate the patient evaluation includes anemia, bipolar -Treatment includes IVF, zofran, percocet -Reevaluation of the patient after these medicines showed that the patient improved -PCP office notes or outside notes reviewed -Discussion with oncoming team who will f/u on labs, reassess pt and determine dispo -Escalation to admission/observation considered: dispo pending.          Final Clinical Impression(s) / ED Diagnoses Final diagnoses:  Vasovagal syncope    Rx / DC Orders ED Discharge Orders     None         Fayrene Helper, PA-C 03/29/23 2101    Rolan Bucco, MD 03/29/23 2125

## 2023-03-29 NOTE — ED Provider Notes (Signed)
Care of patient received from prior provider at 9:01 PM, please see their note for complete H/P and care plan.  Received handoff per ED course.  Clinical Course as of 03/29/23 2101  Sun Mar 29, 2023  2100 Stable syncope  Likely vasovagal  CT Head ok. Small abrasion. Labs. Dispo. [CC]    Clinical Course User Index [CC] Glyn Ade, MD    Reassessment: Well-appearing no acute distress on my assessment.  Patient was comfortable with discharge given resolution of all symptoms.     Glyn Ade, MD 03/29/23 2302

## 2023-03-29 NOTE — Discharge Instructions (Addendum)
You have been eval for your symptoms.  Fortunately no concerning findings were noted on today's exam.  Please follow-up closely with your doctor for further care.

## 2023-04-11 ENCOUNTER — Other Ambulatory Visit (HOSPITAL_COMMUNITY): Payer: Self-pay | Admitting: Psychiatry

## 2023-04-11 DIAGNOSIS — F3162 Bipolar disorder, current episode mixed, moderate: Secondary | ICD-10-CM

## 2023-04-11 DIAGNOSIS — F411 Generalized anxiety disorder: Secondary | ICD-10-CM

## 2023-04-14 ENCOUNTER — Telehealth (HOSPITAL_COMMUNITY): Payer: Self-pay | Admitting: *Deleted

## 2023-04-14 ENCOUNTER — Telehealth (HOSPITAL_COMMUNITY): Payer: Self-pay | Admitting: Psychiatry

## 2023-04-14 NOTE — Telephone Encounter (Signed)
Issue was addressed 

## 2023-04-14 NOTE — Telephone Encounter (Signed)
Mom called asking for her medicine to be called in. States she is fragile and does poorly if not on her meds. Reveiwed record, has rxs available in refills advised her to call the pharmacy re meds. She is not the guardian so limited to information I can share.

## 2023-04-28 ENCOUNTER — Encounter (HOSPITAL_COMMUNITY): Payer: Medicaid Other | Admitting: Psychiatry

## 2023-05-18 ENCOUNTER — Other Ambulatory Visit (HOSPITAL_COMMUNITY): Payer: Self-pay | Admitting: Psychiatry

## 2023-05-18 DIAGNOSIS — F3162 Bipolar disorder, current episode mixed, moderate: Secondary | ICD-10-CM

## 2023-05-18 DIAGNOSIS — F411 Generalized anxiety disorder: Secondary | ICD-10-CM

## 2023-06-04 ENCOUNTER — Telehealth (HOSPITAL_COMMUNITY): Payer: Self-pay | Admitting: *Deleted

## 2023-06-04 NOTE — Telephone Encounter (Signed)
Fax received for prior authorization of Ingrezza. Submitted online with cover my meds. Awaiting decision.

## 2023-06-15 ENCOUNTER — Other Ambulatory Visit (HOSPITAL_COMMUNITY): Payer: Self-pay | Admitting: Psychiatry

## 2023-06-15 DIAGNOSIS — F411 Generalized anxiety disorder: Secondary | ICD-10-CM

## 2023-06-15 DIAGNOSIS — F3162 Bipolar disorder, current episode mixed, moderate: Secondary | ICD-10-CM

## 2023-06-16 ENCOUNTER — Telehealth (HOSPITAL_COMMUNITY): Payer: Self-pay | Admitting: *Deleted

## 2023-06-16 NOTE — Telephone Encounter (Signed)
Checked online with cover my meds for Ingrezza 40mg . Approved until 09/18/23. Called to notified pharmacy.

## 2023-06-17 NOTE — Telephone Encounter (Signed)
Thanks for the update

## 2023-08-13 ENCOUNTER — Telehealth (HOSPITAL_COMMUNITY): Payer: Self-pay | Admitting: *Deleted

## 2023-08-13 NOTE — Telephone Encounter (Signed)
Fax received for prior authorization of Ingrezza. Submitted online with cover my meds. Awaiting decision.

## 2023-08-17 ENCOUNTER — Other Ambulatory Visit (HOSPITAL_COMMUNITY): Payer: Self-pay | Admitting: Psychiatry

## 2023-08-17 DIAGNOSIS — F3162 Bipolar disorder, current episode mixed, moderate: Secondary | ICD-10-CM

## 2023-08-17 DIAGNOSIS — F411 Generalized anxiety disorder: Secondary | ICD-10-CM

## 2023-08-21 ENCOUNTER — Emergency Department (HOSPITAL_COMMUNITY)
Admission: EM | Admit: 2023-08-21 | Discharge: 2023-08-21 | Disposition: A | Discharge status: Home / Self Care | Payer: MEDICAID | Attending: Emergency Medicine | Admitting: Emergency Medicine

## 2023-08-21 ENCOUNTER — Encounter (HOSPITAL_COMMUNITY): Payer: Self-pay | Admitting: Emergency Medicine

## 2023-08-21 DIAGNOSIS — N390 Urinary tract infection, site not specified: Secondary | ICD-10-CM

## 2023-08-21 DIAGNOSIS — I10 Essential (primary) hypertension: Secondary | ICD-10-CM | POA: Insufficient documentation

## 2023-08-21 DIAGNOSIS — R300 Dysuria: Secondary | ICD-10-CM | POA: Diagnosis present

## 2023-08-21 LAB — URINALYSIS, W/ REFLEX TO CULTURE (INFECTION SUSPECTED)
Bilirubin Urine: NEGATIVE
Glucose, UA: NEGATIVE mg/dL
Ketones, ur: NEGATIVE mg/dL
Nitrite: POSITIVE — AB
Protein, ur: >=300 mg/dL — AB
RBC / HPF: >50 RBC/hpf (ref 0–5)
Specific Gravity, Urine: 1.023 (ref 1.005–1.030)
WBC, UA: >50 WBC/hpf (ref 0–5)
pH: 6 (ref 5.0–8.0)

## 2023-08-21 MED ORDER — NITROFURANTOIN MONOHYD MACRO 100 MG PO CAPS
100.0000 mg | ORAL_CAPSULE | Freq: Once | ORAL | Status: AC
  Administered 2023-08-21: 100 mg via ORAL
  Filled 2023-08-21: qty 1

## 2023-08-21 MED ORDER — PHENAZOPYRIDINE HCL 200 MG PO TABS
200.0000 mg | ORAL_TABLET | Freq: Once | ORAL | Status: AC
  Administered 2023-08-21: 200 mg via ORAL
  Filled 2023-08-21: qty 1

## 2023-08-21 MED ORDER — PHENAZOPYRIDINE HCL 200 MG PO TABS: 200.0000 mg | ORAL_TABLET | Freq: Three times a day (TID) | ORAL | 0 refills | Status: DC

## 2023-08-21 MED ORDER — NITROFURANTOIN MONOHYD MACRO 100 MG PO CAPS: 100.0000 mg | ORAL_CAPSULE | Freq: Two times a day (BID) | ORAL | 0 refills | Status: DC

## 2023-08-21 NOTE — ED Provider Notes (Signed)
 [name] Provider Note   CSN: [id number] Arrival date & time: 08/21/23  9090     History  Chief Complaint  Patient presents with   Abdominal Pain    Jeanette Hopkins is a 53 y.o. female.  Pt is a 52y/o female with hx of anemia and HTN who presents today with c/o of 2 days of dysuria, frequency and urgency that started yesterday and has been persistent.  She had also seen blood in the urine.  Low back pain across the back but no flank pain.  Denies prior hx of kidney stones or recurrent UTI's.  Pt denies any vaginal discharge and no menses due to hysterectomy.  She denies N/V or fever.    The history is provided by the patient.  Abdominal Pain      Home Medications Prior to Admission medications   Medication Sig Start Date End Date Taking? Authorizing Provider  nitrofurantoin , macrocrystal-monohydrate, (MACROBID ) 100 MG capsule Take 1 capsule (100 mg total) by mouth 2 (two) times daily. 08/21/23  Yes Doretha Folks, MD  phenazopyridine  (PYRIDIUM ) 200 MG tablet Take 1 tablet (200 mg total) by mouth 3 (three) times daily. 08/21/23  Yes Doretha Folks, MD  propranolol  (INDERAL ) 10 MG tablet Take 1 tablet (10 mg total) by mouth 3 (three) times daily. 02/23/23   Harl Zane BRAVO, NP  QUEtiapine  (SEROQUEL ) 400 MG tablet Take 1 tablet (400 mg total) by mouth at bedtime. 03/17/23   Harl Zane BRAVO, NP  traZODone  (DESYREL ) 150 MG tablet TAKE 1 TABLET(150 MG) BY MOUTH AT BEDTIME 08/17/23   Harl Zane E, NP  valbenazine  (INGREZZA ) 40 MG capsule Take 1 capsule (40 mg total) by mouth daily. 03/17/23   Harl Zane BRAVO, NP      Allergies    Patient has no known allergies.    Review of Systems   Review of Systems  Gastrointestinal:  Positive for abdominal pain.    Physical Exam Updated Vital Signs BP 105/66   Pulse (!) 52   Temp 97.8 F (36.6 C) (Oral)   Resp 18   SpO2 97%  Physical Exam Vitals and nursing note  reviewed.  Constitutional:      General: She is not in acute distress.    Appearance: She is well-developed.  HENT:     Head: Normocephalic and atraumatic.  Eyes:     Pupils: Pupils are equal, round, and reactive to light.  Cardiovascular:     Rate and Rhythm: Normal rate and regular rhythm.     Heart sounds: Normal heart sounds. No murmur heard.    No friction rub.  Pulmonary:     Effort: Pulmonary effort is normal.     Breath sounds: Normal breath sounds. No wheezing or rales.  Abdominal:     General: Bowel sounds are normal. There is no distension.     Palpations: Abdomen is soft.     Tenderness: There is abdominal tenderness in the suprapubic area. There is no right CVA tenderness, left CVA tenderness, guarding or rebound.  Musculoskeletal:        General: No tenderness. Normal range of motion.     Comments: No edema  Skin:    General: Skin is warm and dry.     Findings: No rash.  Neurological:     Mental Status: She is alert and oriented to person, place, and time.     Cranial Nerves: No cranial nerve deficit.  Psychiatric:  Behavior: Behavior normal.     ED Results / Procedures / Treatments   Labs (all labs ordered are listed, but only abnormal results are displayed) Labs Reviewed  URINALYSIS, W/ REFLEX TO CULTURE (INFECTION SUSPECTED) - Abnormal; Notable for the following components:      Result Value   APPearance CLOUDY (*)    Hgb urine dipstick LARGE (*)    Protein, ur >=300 (*)    Nitrite POSITIVE (*)    Leukocytes,Ua MODERATE (*)    Bacteria, UA RARE (*)    All other components within normal limits  URINE CULTURE    EKG None  Radiology No results found.  Procedures Procedures    Medications Ordered in ED Medications  nitrofurantoin  (macrocrystal-monohydrate) (MACROBID ) capsule 100 mg (has no administration in time range)  phenazopyridine  (PYRIDIUM ) tablet 200 mg (200 mg Oral Given 08/21/23 1041)    ED Course/ Medical Decision Making/  A&P                                 Medical Decision Making Amount and/or Complexity of Data Reviewed Labs: ordered. Decision-making details documented in ED Course.  Risk Prescription drug management.   Pt with sx most consistent with UTI.  Pt does not have finding suggestive of kidney stone or pyelonephritis.  Patient is well-appearing and denies any nausea or vomiting.  Initial blood pressure was borderline at 97/55 but will recheck.  Patient is not tachycardic and does not appear toxic at this time.  Low suspicion for sepsis.  Also low spit suspicion for STI as she is not having any vaginal discharge.  She has had a hysterectomy so no concern for pregnancy.  I independently interpreted patient's labs and UA is consistent with a UTI today.  Repeat blood pressure is normal at 105/66.  Patient is eating and in no acute distress at this time.  A culture was sent.  Patient started on Macrobid .        Final Clinical Impression(s) / ED Diagnoses Final diagnoses:  Lower urinary tract infectious disease    Rx / DC Orders ED Discharge Orders          Ordered    nitrofurantoin , macrocrystal-monohydrate, (MACROBID ) 100 MG capsule  2 times daily        08/21/23 1127    phenazopyridine  (PYRIDIUM ) 200 MG tablet  3 times daily        08/21/23 1127              Doretha Folks, MD 08/21/23 1127

## 2023-08-21 NOTE — ED Triage Notes (Signed)
 Pt arrives via EMS from [facility] with mid abd pain, hematuria and HA since yesterday morning.

## 2023-08-23 LAB — URINE CULTURE: Culture: >=100000 — AB

## 2023-08-24 ENCOUNTER — Telehealth (HOSPITAL_BASED_OUTPATIENT_CLINIC_OR_DEPARTMENT_OTHER): Payer: Self-pay | Admitting: *Deleted

## 2023-08-24 NOTE — Telephone Encounter (Signed)
 Post ED Visit - Positive Culture Follow-up  Culture report reviewed by antimicrobial stewardship pharmacist: [facility] []  [place], Pharm.D. []  Venetia Gully, Pharm.D., BCPS AQ-ID []  Garrel Crews, Pharm.D., BCPS []  Almarie Lunger, Pharm.D., BCPS []  [place], [address].D., BCPS, AAHIVP []  Rosaline Bihari, Pharm.D., BCPS, AAHIVP []  Vernell Meier, PharmD, BCPS []  Latanya Hint, PharmD, BCPS []  Donald Medley, PharmD, BCPS []  Rocky Bold, PharmD []  Dorothyann Alert, PharmD, BCPS []  Morene Babe, PharmD  [facility] []  Rosaline Edison, PharmD []  Romona Bliss, PharmD []  Dolphus Roller, PharmD []  Veva Seip, Rph []  Vernell Daunt) Leonce, PharmD []  Eva Allis, PharmD []  Rosaline Millet, PharmD []  Iantha Batch, PharmD []  Arvin Gauss, PharmD []  Wanda Hasting, PharmD []  Ronal Rav, PharmD []  Rocky Slade, PharmD [x]  Almarie Lunger, PharmD   Positive urine culture Treated with Nitrofurantoin , organism sensitive to the same and no further patient follow-up is required at this time.  Albino Alan Novak 08/24/2023, 9:15 AM

## 2023-09-10 ENCOUNTER — Other Ambulatory Visit (HOSPITAL_COMMUNITY): Payer: Self-pay | Admitting: Psychiatry

## 2023-09-10 DIAGNOSIS — F3162 Bipolar disorder, current episode mixed, moderate: Secondary | ICD-10-CM

## 2023-09-10 DIAGNOSIS — F411 Generalized anxiety disorder: Secondary | ICD-10-CM

## 2023-10-20 ENCOUNTER — Other Ambulatory Visit (HOSPITAL_COMMUNITY): Payer: Self-pay | Admitting: Psychiatry

## 2023-10-20 DIAGNOSIS — G2401 Drug induced subacute dyskinesia: Secondary | ICD-10-CM

## 2023-10-20 DIAGNOSIS — F3162 Bipolar disorder, current episode mixed, moderate: Secondary | ICD-10-CM
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
## 2023-10-30 ENCOUNTER — Other Ambulatory Visit (HOSPITAL_COMMUNITY): Payer: Self-pay | Admitting: Psychiatry

## 2023-10-30 ENCOUNTER — Encounter (HOSPITAL_COMMUNITY): Payer: Self-pay | Admitting: Psychiatry

## 2023-10-30 ENCOUNTER — Ambulatory Visit (HOSPITAL_COMMUNITY): Payer: MEDICAID | Admitting: Psychiatry

## 2023-10-30 DIAGNOSIS — F22 Delusional disorders: Secondary | ICD-10-CM

## 2023-10-30 DIAGNOSIS — G2401 Drug induced subacute dyskinesia: Secondary | ICD-10-CM

## 2023-10-30 DIAGNOSIS — F1490 Cocaine use, unspecified, uncomplicated: Secondary | ICD-10-CM | POA: Diagnosis not present

## 2023-10-30 DIAGNOSIS — F1410 Cocaine abuse, uncomplicated: Secondary | ICD-10-CM | POA: Diagnosis not present

## 2023-10-30 DIAGNOSIS — F1290 Cannabis use, unspecified, uncomplicated: Secondary | ICD-10-CM | POA: Diagnosis not present

## 2023-10-30 DIAGNOSIS — Z8659 Personal history of other mental and behavioral disorders: Secondary | ICD-10-CM

## 2023-10-30 DIAGNOSIS — F3162 Bipolar disorder, current episode mixed, moderate: Secondary | ICD-10-CM

## 2023-10-30 DIAGNOSIS — F411 Generalized anxiety disorder: Secondary | ICD-10-CM

## 2023-10-30 DIAGNOSIS — F29 Unspecified psychosis not due to a substance or known physiological condition: Secondary | ICD-10-CM | POA: Diagnosis not present

## 2023-10-30 MED ORDER — QUETIAPINE FUMARATE 400 MG PO TABS: ORAL_TABLET | ORAL | 1 refills | Status: DC

## 2023-10-30 MED ORDER — TRAZODONE HCL 150 MG PO TABS
150.0000 mg | ORAL_TABLET | Freq: Every day | ORAL | 1 refills | Status: DC
Start: 2023-10-30 — End: 2023-12-23

## 2023-10-30 MED ORDER — VALBENAZINE TOSYLATE 40 MG PO CAPS: 40.0000 mg | ORAL_CAPSULE | Freq: Every day | ORAL | 1 refills | Status: AC

## 2023-11-03 ENCOUNTER — Telehealth (HOSPITAL_COMMUNITY): Payer: Self-pay

## 2023-11-03 NOTE — Telephone Encounter (Signed)
 Pa was started for Ingezza 40MG  .

## 2023-11-04 ENCOUNTER — Telehealth (HOSPITAL_COMMUNITY): Payer: Self-pay | Admitting: *Deleted

## 2023-11-04 ENCOUNTER — Telehealth (HOSPITAL_COMMUNITY): Payer: Self-pay | Admitting: Psychiatry

## 2023-11-04 NOTE — Telephone Encounter (Signed)
 Initial PA for Ingrezza denied. She will not qualify for patient assistance as she does have [employer]. Resubmitted request as a challenge but case hard to make as she does not have a current AIMs and in Dr note says no to akathesia and no visible movements on camera. Waiting on response.

## 2023-11-04 NOTE — Telephone Encounter (Signed)
 PA for Jeanette Hopkins has been denied.Will challenge it online

## 2023-11-04 NOTE — Telephone Encounter (Signed)
 Patients quetiapine has been approved per [facility]. Pharmacy notified

## 2023-11-04 NOTE — Telephone Encounter (Signed)
 As discussed at last visit with patient, this writer called patient's mother on 11/03/23 to check in regarding medications and current status of symptoms. Spoke for approx. 5 minute phone call:  Mom reports she is not currently with Rene Kocher but they have had trouble obtaining her medications from the pharmacy. They were just at the pharmacy today and were told both Ingrezza and Seroquel require prior authorization. She reports she has been able to maintain a close eye on patient and there has been no worsening since this writer's last evaluation. She remains comfortable with her remaining in outpatient level of care. Reviewed emergency resources if symptoms worsen as we work to resolve medication issues.  This Clinical research associate coordinated with clinical support staff who initiated PA for Ingrezza (pending) and Seroquel (approved). Will make sure mom/patient are aware and perform telephone check-in once she has started medication to assess response.  Daine Gip, MD 11/04/23

## 2023-11-11 ENCOUNTER — Telehealth (HOSPITAL_COMMUNITY): Payer: Self-pay | Admitting: Psychiatry

## 2023-11-11 NOTE — Telephone Encounter (Signed)
 Called patient's mother to check in and ensure they were able to pick up medications for [facility]. Received voicemail and left nondescript VM with callback number if there are any issues or concerns.   On review of dispense history, it does appear that Seroquel 400 mg was picked up on 11/04/23 and trazodone 150 mg was picked up on 10/30/23. Ingrezza likely not approved due to lack of updated AIMS which will be performed at future appointment once more stable.  Daine Gip, MD 11/11/23

## 2023-11-17 ENCOUNTER — Telehealth (HOSPITAL_COMMUNITY): Payer: Self-pay | Admitting: *Deleted

## 2023-11-17 NOTE — Telephone Encounter (Signed)
 Mother called asking for Jeanette Hopkins states she is getting worse and moving around a lot. Also getting more irritable. Was able to get all her medications but that one. Also asking for a return call so she can discuss what she needs to do with her daughter because she feels her medication is not working. She is asking for help in discussing her care plan. Will ask MD if samples of Ingrezza would be appropriate since PA was denied.

## 2023-11-18 ENCOUNTER — Telehealth (HOSPITAL_COMMUNITY): Payer: Self-pay | Admitting: *Deleted

## 2023-11-18 NOTE — Telephone Encounter (Signed)
 Patient arrived to pick up 3 boxes of Ingrezza 40mg  as samples approved by her psychiatrist Dr. Josephina Shih. Patient states that she is unable to stop moving and that the medication helps her tremendously. Very appreciative for receiving samples.

## 2023-12-22 NOTE — Progress Notes (Unsigned)
 Patient did not connect for virtual psychiatric medication management appointment on 12/23/23 at [facility]. Sent secure video link with no response. Called phone with no answer; left VM with callback number to reschedule.  Adell Hones, MD 12/23/23

## 2023-12-23 ENCOUNTER — Other Ambulatory Visit (HOSPITAL_COMMUNITY): Payer: Self-pay | Admitting: Psychiatry

## 2023-12-23 ENCOUNTER — Encounter (HOSPITAL_COMMUNITY): Payer: Self-pay

## 2023-12-23 ENCOUNTER — Encounter (HOSPITAL_COMMUNITY): Payer: MEDICAID | Admitting: Psychiatry

## 2023-12-23 DIAGNOSIS — F29 Unspecified psychosis not due to a substance or known physiological condition: Secondary | ICD-10-CM

## 2023-12-23 DIAGNOSIS — F22 Delusional disorders: Secondary | ICD-10-CM

## 2023-12-23 MED ORDER — QUETIAPINE FUMARATE 400 MG PO TABS
400.0000 mg | ORAL_TABLET | Freq: Every day | ORAL | 1 refills | Status: DC
Start: 2023-12-23 — End: 2024-02-22

## 2023-12-23 MED ORDER — TRAZODONE HCL 150 MG PO TABS
150.0000 mg | ORAL_TABLET | Freq: Every day | ORAL | 1 refills | Status: DC
Start: 2023-12-23 — End: 2024-02-22

## 2023-12-24 ENCOUNTER — Telehealth (HOSPITAL_COMMUNITY): Payer: Self-pay | Admitting: *Deleted

## 2023-12-24 NOTE — Telephone Encounter (Signed)
 Will forward to Dr. Eligio Grumbling

## 2023-12-24 NOTE — Telephone Encounter (Signed)
 Pt last seen 10/30/2023. Pt is asking for a bridge of all Psych meds.    JNL, CMA
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
## 2024-02-22 ENCOUNTER — Telehealth (INDEPENDENT_AMBULATORY_CARE_PROVIDER_SITE_OTHER): Payer: MEDICAID | Admitting: Psychiatry

## 2024-02-22 ENCOUNTER — Encounter (HOSPITAL_COMMUNITY): Payer: Self-pay | Admitting: Psychiatry

## 2024-02-22 DIAGNOSIS — F1290 Cannabis use, unspecified, uncomplicated: Secondary | ICD-10-CM | POA: Diagnosis not present

## 2024-02-22 DIAGNOSIS — F22 Delusional disorders: Secondary | ICD-10-CM | POA: Diagnosis not present

## 2024-02-22 DIAGNOSIS — F29 Unspecified psychosis not due to a substance or known physiological condition: Secondary | ICD-10-CM | POA: Diagnosis not present

## 2024-02-22 DIAGNOSIS — F1410 Cocaine abuse, uncomplicated: Secondary | ICD-10-CM

## 2024-02-22 MED ORDER — QUETIAPINE FUMARATE 400 MG PO TABS: 400.0000 mg | ORAL_TABLET | Freq: Every day | ORAL | 1 refills | Status: DC

## 2024-02-22 MED ORDER — TRAZODONE HCL 150 MG PO TABS: 150.0000 mg | ORAL_TABLET | Freq: Every day | ORAL | 1 refills | Status: DC

## 2024-02-22 NOTE — Patient Instructions (Signed)

## 2024-02-23 ENCOUNTER — Telehealth (HOSPITAL_COMMUNITY): Payer: Self-pay | Admitting: Psychiatry

## 2024-02-23 ENCOUNTER — Telehealth (HOSPITAL_COMMUNITY): Payer: Self-pay

## 2024-02-23 NOTE — Telephone Encounter (Signed)
 Patient's mother is requesting a call from provider, in regards of next step in treatment and understanding of the recent events that occur. Patient's mother declined to offer further details for provider message.
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
## 2024-02-24 ENCOUNTER — Telehealth (HOSPITAL_COMMUNITY): Payer: Self-pay | Admitting: Psychiatry
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
## 2024-03-22 ENCOUNTER — Telehealth (HOSPITAL_COMMUNITY): Payer: Self-pay | Admitting: Psychiatry

## 2024-03-22 ENCOUNTER — Encounter (HOSPITAL_COMMUNITY): Payer: Self-pay

## 2024-03-22 ENCOUNTER — Encounter (HOSPITAL_COMMUNITY): Payer: MEDICAID | Admitting: Psychiatry

## 2024-03-22 DIAGNOSIS — F22 Delusional disorders: Secondary | ICD-10-CM

## 2024-03-22 DIAGNOSIS — F29 Unspecified psychosis not due to a substance or known physiological condition: Secondary | ICD-10-CM

## 2024-03-22 MED ORDER — QUETIAPINE FUMARATE 400 MG PO TABS: 400.0000 mg | ORAL_TABLET | Freq: Every day | ORAL | 0 refills | Status: DC

## 2024-03-22 MED ORDER — TRAZODONE HCL 150 MG PO TABS: 150.0000 mg | ORAL_TABLET | Freq: Every day | ORAL | 0 refills | Status: DC

## 2024-03-22 NOTE — Telephone Encounter (Signed)
 Called pt to see if she was on her way to today's appt. Patient mom Elverna) stated patient is @ home & she does not have a phone. Mom states she is @ her grandaughter's orientation & can not be at 2 places @ once & she had to bring her granddaughter to orientation. (Although she called last week to confirm today's appt.) Mom asked if I could have Dr. Mercy call her back to reschedule. After speaking to Dr.  Mercy & checking patients chart today's visit made 3 no shows. Following N/S policy patient was asked to come in as a walk in with the proper instructions. Mom agreed but still requesting Dr. Mercy give her a call. I told mom Dr.  Mercy has agreed to send a med refill in for 30 days. Mom again agreed but still requesting a call from Dr. Mercy. Mom states she would bring her here as a walk in.

## 2024-03-22 NOTE — Telephone Encounter (Signed)
 Patient and patient's mother did not show for scheduled psychiatric management appointment on 03/22/24 at 1PM. Of note, this writer had confirmed this appointment with mom on 02/24/24 and spent extensive amount of time partnering with mom to discuss supports to allow visit to go smoothly. Mom called clinic last week to verify today's appointment as well.  When patient did not present to clinic, front desk reached out to mom (patient does not have phone; only means of reaching patient is through mom's phone). She states that she is at granddaughter's orientation and they are unable to make appointment. Front desk staff reviewed that as this is patient's 3rd no show in the last year, she will need to present as walk in for medication management. Walk-in hours reviewed. This Clinical research associate to send in 30 day supply of medications to bridge until patient can present as walk in. Mother expressed understanding.  LAURAINE DELENA PUMMEL, MD 03/22/24

## 2024-04-07 ENCOUNTER — Ambulatory Visit (HOSPITAL_COMMUNITY): Payer: MEDICAID

## 2024-05-18 ENCOUNTER — Ambulatory Visit (HOSPITAL_COMMUNITY)
Admission: EM | Admit: 2024-05-18 | Discharge: 2024-05-18 | Disposition: A | Discharge status: Home / Self Care | Payer: MEDICAID | Attending: Emergency Medicine | Admitting: Emergency Medicine

## 2024-05-18 ENCOUNTER — Encounter (HOSPITAL_COMMUNITY): Payer: Self-pay

## 2024-05-18 DIAGNOSIS — H60393 Other infective otitis externa, bilateral: Secondary | ICD-10-CM

## 2024-05-18 MED ORDER — AMOXICILLIN-POT CLAVULANATE 875-125 MG PO TABS: 1.0000 | ORAL_TABLET | Freq: Two times a day (BID) | ORAL | 0 refills | Status: AC

## 2024-05-18 MED ORDER — CIPROFLOXACIN-DEXAMETHASONE 0.3-0.1 % OT SUSP: 4.0000 [drp] | Freq: Two times a day (BID) | OTIC | 0 refills | Status: AC

## 2024-05-18 NOTE — ED Triage Notes (Signed)
 Pt states bilateral ear pain for the past 2 weeks.  States she has not been taking anything for the pain.
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
## 2024-05-18 NOTE — Discharge Instructions (Signed)
 Start taking Augmentin twice daily for 7 days for ear infection. Apply Ciprodex eardrops twice daily for drops each to both ears to help with infection and inflammation as well. You can take 500 to 1000 mg of Tylenol  every 6-8 hours as needed for pain.  Do not exceed 4000 mg in 1 day. Follow-up with your primary care provider or return here as needed.

## 2024-06-14 ENCOUNTER — Other Ambulatory Visit (HOSPITAL_COMMUNITY): Payer: Self-pay | Admitting: Psychiatry

## 2024-06-14 DIAGNOSIS — F29 Unspecified psychosis not due to a substance or known physiological condition: Secondary | ICD-10-CM

## 2024-06-14 DIAGNOSIS — F22 Delusional disorders: Secondary | ICD-10-CM

## 2024-08-05 ENCOUNTER — Ambulatory Visit (HOSPITAL_COMMUNITY)
Admission: EM | Admit: 2024-08-05 | Discharge: 2024-08-06 | Disposition: A | Discharge status: Home / Self Care | Payer: MEDICAID | Attending: Psychiatry | Admitting: Psychiatry

## 2024-08-05 DIAGNOSIS — Z79899 Other long term (current) drug therapy: Secondary | ICD-10-CM | POA: Insufficient documentation

## 2024-08-05 DIAGNOSIS — Z62820 Parent-biological child conflict: Secondary | ICD-10-CM | POA: Insufficient documentation

## 2024-08-05 DIAGNOSIS — R9431 Abnormal electrocardiogram [ECG] [EKG]: Secondary | ICD-10-CM

## 2024-08-05 DIAGNOSIS — F319 Bipolar disorder, unspecified: Secondary | ICD-10-CM | POA: Diagnosis not present

## 2024-08-05 DIAGNOSIS — Z560 Unemployment, unspecified: Secondary | ICD-10-CM | POA: Diagnosis not present

## 2024-08-05 DIAGNOSIS — Z5982 Transportation insecurity: Secondary | ICD-10-CM | POA: Insufficient documentation

## 2024-08-05 DIAGNOSIS — R001 Bradycardia, unspecified: Secondary | ICD-10-CM | POA: Diagnosis not present

## 2024-08-05 DIAGNOSIS — Z59869 Financial insecurity, unspecified: Secondary | ICD-10-CM | POA: Diagnosis not present

## 2024-08-05 DIAGNOSIS — F1414 Cocaine abuse with cocaine-induced mood disorder: Secondary | ICD-10-CM | POA: Diagnosis not present

## 2024-08-05 LAB — URINALYSIS, MICROSCOPIC (REFLEX): Bacteria, UA: NONE SEEN

## 2024-08-05 LAB — URINALYSIS, ROUTINE W REFLEX MICROSCOPIC
Bilirubin Urine: NEGATIVE
Glucose, UA: NEGATIVE mg/dL
Ketones, ur: NEGATIVE mg/dL
Leukocytes,Ua: NEGATIVE
Nitrite: NEGATIVE
Protein, ur: NEGATIVE mg/dL
Specific Gravity, Urine: 1.025 (ref 1.005–1.030)
pH: 5.5 (ref 5.0–8.0)

## 2024-08-05 LAB — POCT URINE DRUG SCREEN - MANUAL ENTRY (I-SCREEN)
POC Amphetamine UR: NOT DETECTED
POC Buprenorphine (BUP): NOT DETECTED
POC Cocaine UR: POSITIVE — AB
POC Marijuana UR: NOT DETECTED
POC Methadone UR: NOT DETECTED
POC Methamphetamine UR: NOT DETECTED
POC Morphine: NOT DETECTED
POC Oxazepam (BZO): NOT DETECTED
POC Oxycodone UR: NOT DETECTED
POC Secobarbital (BAR): NOT DETECTED

## 2024-08-05 LAB — POC URINE PREG, ED: Preg Test, Ur: NEGATIVE

## 2024-08-05 MED ORDER — DIPHENHYDRAMINE HCL 50 MG/ML IJ SOLN: 50.0000 mg | Freq: Three times a day (TID) | INTRAMUSCULAR | Status: DC | PRN

## 2024-08-05 MED ORDER — ACETAMINOPHEN 325 MG PO TABS: 650.0000 mg | ORAL_TABLET | Freq: Four times a day (QID) | ORAL | Status: DC | PRN

## 2024-08-05 MED ORDER — HYDROXYZINE HCL 25 MG PO TABS
25.0000 mg | ORAL_TABLET | Freq: Three times a day (TID) | ORAL | Status: DC | PRN
  Administered 2024-08-05: 25 mg via ORAL
  Filled 2024-08-05: qty 1
  Filled 2024-08-05: qty 20

## 2024-08-05 MED ORDER — QUETIAPINE FUMARATE 300 MG PO TABS
300.0000 mg | ORAL_TABLET | Freq: Every day | ORAL | Status: DC
  Administered 2024-08-05: 300 mg via ORAL
  Filled 2024-08-05: qty 1
  Filled 2024-08-05: qty 7

## 2024-08-05 MED ORDER — HALOPERIDOL LACTATE 5 MG/ML IJ SOLN: 5.0000 mg | Freq: Three times a day (TID) | INTRAMUSCULAR | Status: DC | PRN

## 2024-08-05 MED ORDER — ALUM & MAG HYDROXIDE-SIMETH 200-200-20 MG/5ML PO SUSP: 30.0000 mL | ORAL | Status: DC | PRN

## 2024-08-05 MED ORDER — TRAZODONE HCL 100 MG PO TABS
100.0000 mg | ORAL_TABLET | Freq: Every evening | ORAL | Status: DC | PRN
  Administered 2024-08-05: 100 mg via ORAL
  Filled 2024-08-05: qty 1
  Filled 2024-08-05: qty 7

## 2024-08-05 MED ORDER — LORAZEPAM 2 MG/ML IJ SOLN: 2.0000 mg | Freq: Three times a day (TID) | INTRAMUSCULAR | Status: DC | PRN

## 2024-08-05 MED ORDER — HALOPERIDOL 5 MG PO TABS: 5.0000 mg | ORAL_TABLET | Freq: Three times a day (TID) | ORAL | Status: DC | PRN

## 2024-08-05 MED ORDER — NICOTINE POLACRILEX 2 MG MT GUM: 2.0000 mg | CHEWING_GUM | OROMUCOSAL | Status: DC | PRN

## 2024-08-05 MED ORDER — DIPHENHYDRAMINE HCL 50 MG PO CAPS: 50.0000 mg | ORAL_CAPSULE | Freq: Three times a day (TID) | ORAL | Status: DC | PRN

## 2024-08-05 MED ORDER — HALOPERIDOL LACTATE 5 MG/ML IJ SOLN: 10.0000 mg | Freq: Three times a day (TID) | INTRAMUSCULAR | Status: DC | PRN

## 2024-08-05 MED ORDER — MAGNESIUM HYDROXIDE 400 MG/5ML PO SUSP: 30.0000 mL | Freq: Every day | ORAL | Status: DC | PRN

## 2024-08-05 NOTE — ED Notes (Signed)
 Patient was provided dinner
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
## 2024-08-05 NOTE — ED Notes (Signed)
 Patient is resting in bed with eyes closed, respirations even and unlabored, no distress noted, will continue to monitor for safety
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
## 2024-08-05 NOTE — ED Notes (Signed)
 Pt refused blood work able to get urine sample [facility] called to transport sample to lab
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
## 2024-08-06 DIAGNOSIS — R001 Bradycardia, unspecified: Secondary | ICD-10-CM

## 2024-08-06 DIAGNOSIS — F319 Bipolar disorder, unspecified: Secondary | ICD-10-CM | POA: Diagnosis not present

## 2024-08-06 DIAGNOSIS — F1414 Cocaine abuse with cocaine-induced mood disorder: Secondary | ICD-10-CM | POA: Diagnosis not present

## 2024-08-06 DIAGNOSIS — R9431 Abnormal electrocardiogram [ECG] [EKG]: Secondary | ICD-10-CM | POA: Diagnosis not present

## 2024-08-06 MED ORDER — HYDROXYZINE HCL 25 MG PO TABS: 25.0000 mg | ORAL_TABLET | Freq: Three times a day (TID) | ORAL | Status: AC | PRN

## 2024-08-06 MED ORDER — QUETIAPINE FUMARATE 300 MG PO TABS: 300.0000 mg | ORAL_TABLET | Freq: Every day | ORAL | Status: AC

## 2024-08-06 MED ORDER — TRAZODONE HCL 100 MG PO TABS: 100.0000 mg | ORAL_TABLET | Freq: Every evening | ORAL | Status: AC | PRN

## 2024-08-06 NOTE — ED Notes (Signed)

## 2024-08-06 NOTE — Discharge Instructions (Signed)
 Discharge recommendations:   Medications: Patient is to take medications as prescribed. The patient or patient's guardian is to contact a medical professional and/or outpatient provider to address any new side effects that develop. The patient or the patient's guardian should update outpatient providers of any new medications and/or medication changes.    Outpatient Follow up: Please review list of outpatient resources for psychiatry and counseling. Please follow up with your primary care provider for all medical related needs.    Therapy: We recommend that patient participate in individual therapy to address mental health concerns.   Atypical antipsychotics: If you are prescribed an atypical antipsychotic, it is recommended that your height, weight, BMI, blood pressure, fasting lipid panel, and fasting blood sugar be monitored by your outpatient providers.  Safety:   The following safety precautions should be taken:   No sharp objects. This includes scissors, razors, scrapers, and putty knives.   Chemicals should be removed and locked up.   Medications should be removed and locked up.   Weapons should be removed and locked up. This includes firearms, knives and instruments that can be used to cause injury.   The patient should abstain from use of illicit substances/drugs and abuse of any medications.  If symptoms worsen or do not continue to improve or if the patient becomes actively suicidal or homicidal then it is recommended that the patient return to the closest hospital emergency department, the [facility], or call 911 for further evaluation and treatment. National Suicide Prevention Lifeline 1-800-SUICIDE or 647-417-0965.  About 988 988 offers 24/7 access to trained crisis counselors who can help people experiencing mental health-related distress. People can call or text 988 or chat 988lifeline.org for themselves or if they are worried about a  loved one who may need crisis support.     You are encouraged to follow up with [facility] for outpatient treatment.  Walk in/ Open Access Hours: Monday - Friday 8AM - 10AM (To see provider and therapist) PLEASE ARRIVE AT 7:00AM  [facility] [address] [place], [place] [phone]

## 2024-08-06 NOTE — ED Notes (Signed)
 Patient A&O x 4, ambulatory. Patient discharged in no acute distress. Patient denied SI/HI, A/VH upon discharge. Patient verbalized understanding of all discharge instructions reviewed on AVS via staff, to include follow up appointments, RX's and safety. Suicide safety plan completed and reviewed with Clinical research associate. A copy given to pt. Pt belongings returned to patient from locker intact. Patient escorted to lobby via staff for transport to destination. Safety maintained.

## 2024-08-06 NOTE — ED Notes (Signed)
 Pt sleeping at present, no distress noted, monitoring for safety.
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
## 2024-09-01 ENCOUNTER — Telehealth (HOSPITAL_COMMUNITY): Payer: Self-pay | Admitting: *Deleted

## 2024-09-01 NOTE — Telephone Encounter (Signed)
 Fax request from [facility] for ingrezza . Reviewed record and no rx for ingrezza  on chart. Documentation of plan for her to get samples but I dont see where she actually got any due to behavior issues making it hard for her to wait for staff to help her. She has not ben seen since 02/21/25 and has no futue appt scheduled. Her previous provider is not here any longer and has not seen a new provider yet. At this time I am not able to provide her an rx and she should be able to have it sent into pharmacy as she has mcd.
